# Patient Record
Sex: Female | Born: 1980 | Race: Black or African American | Hispanic: No | Marital: Single | Smoking: Never smoker
Health system: Southern US, Community
[De-identification: ages and names within clinical notes are randomized; demographics above are authoritative.]

## PROBLEM LIST (undated history)

## (undated) DIAGNOSIS — Z5189 Encounter for other specified aftercare: Secondary | ICD-10-CM

## (undated) DIAGNOSIS — E039 Hypothyroidism, unspecified: Secondary | ICD-10-CM

## (undated) DIAGNOSIS — I10 Essential (primary) hypertension: Secondary | ICD-10-CM

## (undated) DIAGNOSIS — R011 Cardiac murmur, unspecified: Secondary | ICD-10-CM

## (undated) DIAGNOSIS — S060XAA Concussion with loss of consciousness status unknown, initial encounter: Secondary | ICD-10-CM

## (undated) DIAGNOSIS — D649 Anemia, unspecified: Secondary | ICD-10-CM

## (undated) DIAGNOSIS — K529 Noninfective gastroenteritis and colitis, unspecified: Secondary | ICD-10-CM

## (undated) DIAGNOSIS — K5790 Diverticulosis of intestine, part unspecified, without perforation or abscess without bleeding: Secondary | ICD-10-CM

## (undated) DIAGNOSIS — S060X9A Concussion with loss of consciousness of unspecified duration, initial encounter: Secondary | ICD-10-CM

## (undated) DIAGNOSIS — H409 Unspecified glaucoma: Secondary | ICD-10-CM

## (undated) HISTORY — DX: Diverticulosis of intestine, part unspecified, without perforation or abscess without bleeding: K57.90

## (undated) HISTORY — DX: Essential (primary) hypertension: I10

## (undated) HISTORY — PX: ROTATOR CUFF REPAIR: SHX139

## (undated) HISTORY — DX: Concussion with loss of consciousness of unspecified duration, initial encounter: S06.0X9A

## (undated) HISTORY — PX: DILATATION & CURETTAGE/HYSTEROSCOPY WITH MYOSURE: SHX6511

## (undated) HISTORY — DX: Unspecified glaucoma: H40.9

## (undated) HISTORY — DX: Cardiac murmur, unspecified: R01.1

## (undated) HISTORY — DX: Noninfective gastroenteritis and colitis, unspecified: K52.9

## (undated) HISTORY — PX: COLONOSCOPY: SHX174

## (undated) HISTORY — DX: Concussion with loss of consciousness status unknown, initial encounter: S06.0XAA

## (undated) HISTORY — DX: Hypothyroidism, unspecified: E03.9

## (undated) HISTORY — PX: OTHER SURGICAL HISTORY: SHX169

## (undated) HISTORY — PX: TUBAL LIGATION: SHX77

## (undated) HISTORY — PX: HYSTEROSCOPY: SHX211

## (undated) HISTORY — PX: CYST EXCISION: SHX5701

---

## 2017-04-06 ENCOUNTER — Emergency Department (HOSPITAL_BASED_OUTPATIENT_CLINIC_OR_DEPARTMENT_OTHER)
Admission: EM | Admit: 2017-04-06 | Discharge: 2017-04-06 | Disposition: A | Discharge status: Home / Self Care | Payer: Medicaid - Out of State | Attending: Emergency Medicine | Admitting: Emergency Medicine

## 2017-04-06 ENCOUNTER — Emergency Department (HOSPITAL_BASED_OUTPATIENT_CLINIC_OR_DEPARTMENT_OTHER): Payer: Medicaid - Out of State

## 2017-04-06 ENCOUNTER — Encounter (HOSPITAL_BASED_OUTPATIENT_CLINIC_OR_DEPARTMENT_OTHER): Payer: Self-pay

## 2017-04-06 DIAGNOSIS — M545 Low back pain, unspecified: Secondary | ICD-10-CM

## 2017-04-06 DIAGNOSIS — Z91040 Latex allergy status: Secondary | ICD-10-CM | POA: Diagnosis not present

## 2017-04-06 DIAGNOSIS — R5383 Other fatigue: Secondary | ICD-10-CM | POA: Diagnosis present

## 2017-04-06 DIAGNOSIS — R1032 Left lower quadrant pain: Secondary | ICD-10-CM | POA: Insufficient documentation

## 2017-04-06 HISTORY — DX: Encounter for other specified aftercare: Z51.89

## 2017-04-06 HISTORY — DX: Anemia, unspecified: D64.9

## 2017-04-06 LAB — CBC WITH DIFFERENTIAL/PLATELET
Basophils Absolute: 0 10*3/uL (ref 0.0–0.1)
Basophils Relative: 1 %
Eosinophils Absolute: 0.1 10*3/uL (ref 0.0–0.7)
Eosinophils Relative: 1 %
HEMATOCRIT: 33.4 % — AB (ref 36.0–46.0)
HEMOGLOBIN: 11.4 g/dL — AB (ref 12.0–15.0)
LYMPHS PCT: 30 %
Lymphs Abs: 1.7 10*3/uL (ref 0.7–4.0)
MCH: 31.1 pg (ref 26.0–34.0)
MCHC: 34.1 g/dL (ref 30.0–36.0)
MCV: 91 fL (ref 78.0–100.0)
MONOS PCT: 11 %
Monocytes Absolute: 0.6 10*3/uL (ref 0.1–1.0)
NEUTROS ABS: 3.1 10*3/uL (ref 1.7–7.7)
NEUTROS PCT: 57 %
Platelets: 263 10*3/uL (ref 150–400)
RBC: 3.67 MIL/uL — ABNORMAL LOW (ref 3.87–5.11)
RDW: 12.7 % (ref 11.5–15.5)
WBC: 5.5 10*3/uL (ref 4.0–10.5)

## 2017-04-06 LAB — COMPREHENSIVE METABOLIC PANEL
ALBUMIN: 3.7 g/dL (ref 3.5–5.0)
ALK PHOS: 51 U/L (ref 38–126)
ALT: 10 U/L — ABNORMAL LOW (ref 14–54)
ANION GAP: 4 — AB (ref 5–15)
AST: 13 U/L — AB (ref 15–41)
BUN: 7 mg/dL (ref 6–20)
CO2: 26 mmol/L (ref 22–32)
Calcium: 8.8 mg/dL — ABNORMAL LOW (ref 8.9–10.3)
Chloride: 106 mmol/L (ref 101–111)
Creatinine, Ser: 0.56 mg/dL (ref 0.44–1.00)
GFR calc Af Amer: >60 mL/min (ref >60)
GFR calc non Af Amer: >60 mL/min (ref >60)
GLUCOSE: 81 mg/dL (ref 65–99)
Potassium: 3.9 mmol/L (ref 3.5–5.1)
Sodium: 136 mmol/L (ref 135–145)
Total Bilirubin: 0.2 mg/dL — ABNORMAL LOW (ref 0.3–1.2)
Total Protein: 7.2 g/dL (ref 6.5–8.1)

## 2017-04-06 LAB — URINALYSIS, ROUTINE W REFLEX MICROSCOPIC
BILIRUBIN URINE: NEGATIVE
GLUCOSE, UA: NEGATIVE mg/dL
HGB URINE DIPSTICK: NEGATIVE
Ketones, ur: NEGATIVE mg/dL
Leukocytes, UA: NEGATIVE
NITRITE: NEGATIVE
PH: 7 (ref 5.0–8.0)
Protein, ur: NEGATIVE mg/dL
SPECIFIC GRAVITY, URINE: 1.015 (ref 1.005–1.030)

## 2017-04-06 LAB — LIPASE, BLOOD: Lipase: 30 U/L (ref 11–51)

## 2017-04-06 LAB — PREGNANCY, URINE: Preg Test, Ur: NEGATIVE

## 2017-04-06 MED ORDER — IOPAMIDOL (ISOVUE-300) INJECTION 61%
100.0000 mL | Freq: Once | INTRAVENOUS | Status: AC | PRN
  Administered 2017-04-06: 100 mL via INTRAVENOUS

## 2017-04-06 MED ORDER — TRAMADOL HCL 50 MG PO TABS: 50.0000 mg | ORAL_TABLET | Freq: Four times a day (QID) | ORAL | 0 refills | Status: DC | PRN

## 2017-04-06 MED ORDER — SODIUM CHLORIDE 0.9 % IV BOLUS (SEPSIS)
500.0000 mL | Freq: Once | INTRAVENOUS | Status: AC
  Administered 2017-04-06: 500 mL via INTRAVENOUS

## 2017-04-06 MED ORDER — SODIUM CHLORIDE 0.9 % IV SOLN
INTRAVENOUS | Status: DC
  Administered 2017-04-06: 11:00:00 via INTRAVENOUS

## 2017-04-06 MED FILL — traMADol HCL 50 MG TABS: 50 | 3 days supply | Qty: 15 | Fill #0

## 2017-04-06 NOTE — ED Notes (Signed)
Pt drinking PO contrast

## 2017-04-06 NOTE — ED Provider Notes (Signed)
[facility]   CSN: [id number] Arrival date & time: 04/06/17  0865     History   Chief Complaint Chief Complaint  Patient presents with  . Fatigue    HPI Christine Mccormick is a 36 y.o. female.  Patient status post D&C 5 weeks ago. Patient has past history of anemia. Patient's had increased fatigue since yesterday. Worried about her being anemic again. Also with the complaint of left-sided abdominal pain and left low back pain that radiates down into her leg. No numbness to her feet. No fever no chills. Patient denies any vaginal bleeding or any vaginal discharge at this time. Patient's primary care doctor and OB/GYN or in the [place] area.      Past Medical History:  Diagnosis Date  . Anemia   . Blood transfusion without reported diagnosis     There are no active problems to display for this patient.   Past Surgical History:  Procedure Laterality Date  . DILATATION & CURETTAGE/HYSTEROSCOPY WITH MYOSURE    . HYSTEROSCOPY      OB History    No data available       Home Medications    Prior to Admission medications   Medication Sig Start Date End Date Taking? Authorizing Provider  traMADol (ULTRAM) 50 MG tablet Take 1 tablet (50 mg total) by mouth every 6 (six) hours as needed. 04/06/17   Vanetta Mulders, MD    Family History No family history on file.  Social History Social History  Substance Use Topics  . Smoking status: Never Smoker  . Smokeless tobacco: Never Used  . Alcohol use Yes     Allergies   Latex   Review of Systems Review of Systems  Constitutional: Positive for fatigue. Negative for fever.  HENT: Negative for congestion.   Eyes: Negative for redness.  Respiratory: Negative for shortness of breath.   Cardiovascular: Negative for chest pain.  Gastrointestinal: Positive for abdominal pain.  Genitourinary: Negative for dysuria.  Musculoskeletal: Positive for back pain.  Skin: Negative for rash.    Neurological: Positive for weakness. Negative for headaches.  Hematological: Does not bruise/bleed easily.  Psychiatric/Behavioral: Negative for confusion.     Physical Exam Updated Vital Signs BP 104/74 (BP Location: Right Arm)   Pulse 90   Temp 98.4 F (36.9 C) (Oral)   Resp 18   Ht 1.676 m ( )   Wt 54.4 kg (120 lb)   SpO2 100%   BMI 19.37 kg/m   Physical Exam  Constitutional: She is oriented to person, place, and time. She appears well-developed and well-nourished.  HENT:  Head: Normocephalic and atraumatic.  Mouth/Throat: Oropharynx is clear and moist.  Eyes: Pupils are equal, round, and reactive to light. Conjunctivae and EOM are normal.  Neck: Normal range of motion.  Cardiovascular: Normal rate, regular rhythm and normal heart sounds.   Pulmonary/Chest: Effort normal and breath sounds normal. No respiratory distress.  Abdominal: Soft. Bowel sounds are normal. There is no tenderness.  Musculoskeletal: Normal range of motion.  No tenderness to palpation to the lumbar back area.  Neurological: She is alert and oriented to person, place, and time. No cranial nerve deficit or sensory deficit. She exhibits normal muscle tone. Coordination normal.  Skin: Skin is warm. Capillary refill takes less than 2 seconds. No rash noted.  Nursing note and vitals reviewed.    ED Treatments / Results  Labs (all labs ordered are listed, but only abnormal results are displayed) Labs Reviewed  COMPREHENSIVE  METABOLIC PANEL - Abnormal; Notable for the following:       Result Value   Calcium 8.8 (*)    AST 13 (*)    ALT 10 (*)    Total Bilirubin 0.2 (*)    Anion gap 4 (*)    All other components within normal limits  CBC WITH DIFFERENTIAL/PLATELET - Abnormal; Notable for the following:    RBC 3.67 (*)    Hemoglobin 11.4 (*)    HCT 33.4 (*)    All other components within normal limits  LIPASE, BLOOD  URINALYSIS, ROUTINE W REFLEX MICROSCOPIC  PREGNANCY, URINE    EKG  EKG  Interpretation None       Radiology Ct Abdomen Pelvis W Contrast  Result Date: 04/06/2017 CLINICAL DATA:  Left lower quadrant pain radiating to the left leg. EXAM: CT ABDOMEN AND PELVIS WITH CONTRAST TECHNIQUE: Multidetector CT imaging of the abdomen and pelvis was performed using the standard protocol following bolus administration of intravenous contrast. CONTRAST:  ISOVUE-300 IOPAMIDOL (ISOVUE-300) INJECTION 61% COMPARISON:  None. FINDINGS: Lower chest: No acute abnormality. Hepatobiliary: The liver is normal. No focal liver lesion is identified. There is question gallbladder wall thickening. No inflammation is noted around the gallbladder. The biliary tree is normal. Pancreas: Unremarkable. No pancreatic ductal dilatation or surrounding inflammatory changes. Spleen: Normal in size without focal abnormality. Adrenals/Urinary Tract: Adrenal glands are unremarkable. Kidneys are normal, without renal calculi, focal lesion, or hydronephrosis. Bladder is unremarkable. Stomach/Bowel: Stomach is within normal limits. Appendix appears normal. No evidence of bowel wall thickening, distention, or inflammatory changes. Vascular/Lymphatic: No significant vascular findings are present. No enlarged abdominal or pelvic lymph nodes. Reproductive: There is evidence of prior hysteroscopy with myosure. There is a subtle slight low-density masslike lesion in the left cervix/ left lower uterine segment measuring 3.5 x 4.1 cm. No adnexal masses are noted. Other: No abdominal wall hernia or abnormality. No abdominopelvic ascites. Musculoskeletal: No acute or significant osseous findings. IMPRESSION: No acute abnormality identified within the abdomen. 4.1 cm slight low-density lesion in the left lower segment/left cervix. This is nonspecific but could represent an uterine fibroid. Electronically Signed   By: Sherian Rein M.D.   On: 04/06/2017 12:23    Procedures Procedures (including critical care  time)  Medications Ordered in ED Medications  0.9 %  sodium chloride infusion ( Intravenous New Bag/Given 04/06/17 1059)  sodium chloride 0.9 % bolus 500 mL (0 mLs Intravenous Stopped 04/06/17 1131)  iopamidol (ISOVUE-300) 61 % injection 100 mL (100 mLs Intravenous Contrast Given 04/06/17 1151)     Initial Impression / Assessment and Plan / ED Course  I have reviewed the triage vital signs and the nursing notes.  Pertinent labs & imaging results that were available during my care of the patient were reviewed by me and considered in my medical decision making (see chart for details).    Workup for the fatigue and left-sided abdominal pain and low back pain with radiation in the leg without any focal neural deficits. Without any significant findings. Patient with mild anemia but not severe anemia. Patient's abdomen without significant tenderness. CT scan of the abdomen was negative. Patient denies any vaginal discharge or vaginal bleeding.   Would recommend thyroid function testing be done.     etiology of patient's symptoms is not clear. Patient nontoxic no acute distress. Will provide work note treat with tramadol have patient follow back up with her OB/GYN in [place] and her primary care Dr. Newt Lukes.  Fi as  well as annal Clinical Impressions(s) / ED Diagnoses   Final diagnoses:  Fatigue, unspecified type  Acute left-sided low back pain without sciatica  Left lower quadrant pain    New Prescriptions New Prescriptions   TRAMADOL (ULTRAM) 50 MG TABLET    Take 1 tablet (50 mg total) by mouth every 6 (six) hours as needed.     Vanetta Mulders, MD 04/06/17 1351

## 2017-04-06 NOTE — ED Notes (Signed)
Pt transported to CT ?

## 2017-04-06 NOTE — ED Triage Notes (Signed)
Pt reports recent D&C with hysteroscopy approximately 5 weeks ago. Hx of anemia, no check after surgery. Pt reports increased fatigue since yesterday. Also sts left lower abdominal pain radiating down into left leg.

## 2017-04-06 NOTE — Discharge Instructions (Signed)
Follow-up with your OB/GYN for recheck following the D&C. Take tramadol as directed. Some symptoms seem to be related to low back. Make an appointment for primary care doctor follow-up in the [place] area. Would recommend having thyroid function tests evaluated.

## 2017-05-28 ENCOUNTER — Other Ambulatory Visit: Payer: Self-pay

## 2017-05-28 ENCOUNTER — Encounter (HOSPITAL_COMMUNITY): Payer: Self-pay

## 2017-05-28 ENCOUNTER — Emergency Department (HOSPITAL_COMMUNITY): Payer: Medicaid - Out of State

## 2017-05-28 ENCOUNTER — Emergency Department (HOSPITAL_COMMUNITY)
Admission: EM | Admit: 2017-05-28 | Discharge: 2017-05-29 | Disposition: A | Discharge status: Home / Self Care | Payer: Medicaid - Out of State | Attending: Emergency Medicine | Admitting: Emergency Medicine

## 2017-05-28 DIAGNOSIS — Z79899 Other long term (current) drug therapy: Secondary | ICD-10-CM | POA: Insufficient documentation

## 2017-05-28 DIAGNOSIS — Z91040 Latex allergy status: Secondary | ICD-10-CM | POA: Insufficient documentation

## 2017-05-28 DIAGNOSIS — R1084 Generalized abdominal pain: Secondary | ICD-10-CM | POA: Diagnosis not present

## 2017-05-28 DIAGNOSIS — R0602 Shortness of breath: Secondary | ICD-10-CM

## 2017-05-28 DIAGNOSIS — R079 Chest pain, unspecified: Secondary | ICD-10-CM

## 2017-05-28 LAB — CBC
HEMATOCRIT: 38.1 % (ref 36.0–46.0)
Hemoglobin: 12.7 g/dL (ref 12.0–15.0)
MCH: 30.8 pg (ref 26.0–34.0)
MCHC: 33.3 g/dL (ref 30.0–36.0)
MCV: 92.3 fL (ref 78.0–100.0)
Platelets: 265 10*3/uL (ref 150–400)
RBC: 4.13 MIL/uL (ref 3.87–5.11)
RDW: 13 % (ref 11.5–15.5)
WBC: 7.5 10*3/uL (ref 4.0–10.5)

## 2017-05-28 LAB — BASIC METABOLIC PANEL
ANION GAP: 7 (ref 5–15)
BUN: 13 mg/dL (ref 6–20)
CHLORIDE: 107 mmol/L (ref 101–111)
CO2: 23 mmol/L (ref 22–32)
Calcium: 9.1 mg/dL (ref 8.9–10.3)
Creatinine, Ser: 0.83 mg/dL (ref 0.44–1.00)
GFR calc Af Amer: >60 mL/min (ref >60)
GFR calc non Af Amer: >60 mL/min (ref >60)
GLUCOSE: 109 mg/dL — AB (ref 65–99)
POTASSIUM: 4.2 mmol/L (ref 3.5–5.1)
Sodium: 137 mmol/L (ref 135–145)

## 2017-05-28 LAB — HEPATIC FUNCTION PANEL
ALT: 13 U/L — AB (ref 14–54)
AST: 17 U/L (ref 15–41)
Albumin: 4 g/dL (ref 3.5–5.0)
Alkaline Phosphatase: 57 U/L (ref 38–126)
TOTAL PROTEIN: 7.7 g/dL (ref 6.5–8.1)
Total Bilirubin: 0.3 mg/dL (ref 0.3–1.2)

## 2017-05-28 LAB — LIPASE, BLOOD: LIPASE: 21 U/L (ref 11–51)

## 2017-05-28 LAB — PROTIME-INR
INR: 0.96
Prothrombin Time: 12.7 seconds (ref 11.4–15.2)

## 2017-05-28 LAB — I-STAT TROPONIN, ED: Troponin i, poc: 0 ng/mL (ref 0.00–0.08)

## 2017-05-28 MED ORDER — IOPAMIDOL (ISOVUE-300) INJECTION 61%
INTRAVENOUS | Status: AC
  Administered 2017-05-28: 30 mL via ORAL
  Filled 2017-05-28: qty 30

## 2017-05-28 MED ORDER — IOPAMIDOL (ISOVUE-370) INJECTION 76%
INTRAVENOUS | Status: AC
  Administered 2017-05-28: 100 mL via INTRAVENOUS
  Filled 2017-05-28: qty 100

## 2017-05-28 MED ORDER — HYDROMORPHONE HCL 1 MG/ML IJ SOLN
1.0000 mg | Freq: Once | INTRAMUSCULAR | Status: AC
  Administered 2017-05-28: 1 mg via INTRAVENOUS
  Filled 2017-05-28: qty 1

## 2017-05-28 MED ORDER — ONDANSETRON HCL 4 MG/2ML IJ SOLN
4.0000 mg | Freq: Once | INTRAMUSCULAR | Status: AC
  Administered 2017-05-28: 4 mg via INTRAVENOUS
  Filled 2017-05-28: qty 2

## 2017-05-28 MED ORDER — SODIUM CHLORIDE 0.9 % IV BOLUS (SEPSIS)
1000.0000 mL | Freq: Once | INTRAVENOUS | Status: AC
  Administered 2017-05-28: 1000 mL via INTRAVENOUS

## 2017-05-28 NOTE — ED Provider Notes (Signed)
[facility]-EMERGENCY DEPT Provider Note   CSN: [id number] Arrival date & time: 05/28/17  1831     History   Chief Complaint Chief Complaint  Patient presents with  . Abdominal Cramping    post op  . Cough  . Chest Pain    HPI Carollee HerterLatierra Schlagel is a 36 y.o. female.    The history is provided by the patient, medical records and the spouse. No language interpreter was used.  Chest Pain   This is a new problem. The current episode started 2 days ago. The problem occurs constantly. The problem has not changed since onset.The pain is associated with exertion and breathing. The pain is present in the substernal region. The pain is at a severity of 8/10. The pain is severe. The quality of the pain is described as heavy, pressure-like and exertional. The pain does not radiate. Duration of episode(s) is 3 days. The symptoms are aggravated by deep breathing and exertion. Associated symptoms include abdominal pain, cough, exertional chest pressure, nausea and shortness of breath. Pertinent negatives include no back pain, no diaphoresis, no fever, no headaches, no hemoptysis, no lower extremity edema, no numbness, no palpitations, no sputum production, no syncope, no vomiting and no weakness. She has tried nothing for the symptoms. The treatment provided no relief.  Shortness of Breath  Associated symptoms include cough, chest pain and abdominal pain. Pertinent negatives include no fever, no headaches, no neck pain, no sputum production, no hemoptysis, no wheezing, no syncope, no vomiting, no rash and no leg swelling.    Past Medical History:  Diagnosis Date  . Anemia   . Blood transfusion without reported diagnosis     There are no active problems to display for this patient.   Past Surgical History:  Procedure Laterality Date  . DILATATION & CURETTAGE/HYSTEROSCOPY WITH MYOSURE    . HYSTEROSCOPY      OB History    No data available       Home Medications      Prior to Admission medications   Medication Sig Start Date End Date Taking? Authorizing Provider  traMADol (ULTRAM) 50 MG tablet Take 1 tablet (50 mg total) by mouth every 6 (six) hours as needed. 04/06/17   Vanetta MuldersZackowski, Scott, MD    Family History History reviewed. No pertinent family history.  Social History Social History   Tobacco Use  . Smoking status: Never Smoker  . Smokeless tobacco: Never Used  Substance Use Topics  . Alcohol use: Yes  . Drug use: Not on file     Allergies   Latex   Review of Systems Review of Systems  Constitutional: Positive for chills. Negative for diaphoresis, fatigue and fever.  HENT: Negative for congestion.   Respiratory: Positive for cough, chest tightness and shortness of breath. Negative for hemoptysis, sputum production, wheezing and stridor.   Cardiovascular: Positive for chest pain. Negative for palpitations, leg swelling and syncope.  Gastrointestinal: Positive for abdominal pain and nausea. Negative for constipation, diarrhea and vomiting.  Genitourinary: Negative for dysuria, flank pain and frequency.  Musculoskeletal: Negative for back pain, neck pain and neck stiffness.  Skin: Positive for wound. Negative for rash.  Neurological: Negative for weakness, numbness and headaches.  Psychiatric/Behavioral: Negative for agitation.  All other systems reviewed and are negative.    Physical Exam Updated Vital Signs BP (!) 133/91 (BP Location: Left Arm)   Pulse (!) 114   Temp 98.3 F (36.8 C) (Oral)   Resp 18   SpO2  100%   Physical Exam  Constitutional: She is oriented to person, place, and time. She appears well-developed and well-nourished. No distress.  Cardiovascular: Intact distal pulses. Tachycardia present.  No murmur heard. Pulmonary/Chest: Effort normal. No stridor. Tachypnea noted. No respiratory distress. She has no wheezes. She has no rhonchi. She has no rales. She exhibits no tenderness.  Abdominal: Soft. She  exhibits no distension. There is tenderness in the suprapubic area. There is no CVA tenderness.    Patient has tenderness all over the abdomen but worse in the lower abdomen.  Patient has 3 surgical sites from her surgery several days ago.  No purulence is seen with no erythema present.  Diffuse tenderness.  Bowel sounds present.  Musculoskeletal: She exhibits no edema or tenderness.  Neurological: She is alert and oriented to person, place, and time. No cranial nerve deficit or sensory deficit. She exhibits normal muscle tone.  Skin: Capillary refill takes less than 2 seconds. No rash noted. She is not diaphoretic.  Psychiatric: She has a normal mood and affect.  Nursing note and vitals reviewed.    ED Treatments / Results  Labs (all labs ordered are listed, but only abnormal results are displayed) Labs Reviewed  BASIC METABOLIC PANEL - Abnormal; Notable for the following components:      Result Value   Glucose, Bld 109 (*)    All other components within normal limits  HEPATIC FUNCTION PANEL - Abnormal; Notable for the following components:   ALT 13 (*)    Bilirubin, Direct <0.1 (*)    All other components within normal limits  CBC  PROTIME-INR  LIPASE, BLOOD  I-STAT TROPONIN, ED    EKG  EKG Interpretation  Date/Time:  Saturday May 28 2017 19:44:27 EST Ventricular Rate:  97 PR Interval:    QRS Duration: 74 QT Interval:  328 QTC Calculation: 417 R Axis:   76 Text Interpretation:  Sinus rhythm Borderline short PR interval Baseline wander in lead(s) V1 V5 V6 No prior ECG for comparuson. T wave inversion in lead 3.  No STEMI Confirmed by Theda Belfast ([id number]) on 05/28/2017 8:21:08 PM       Radiology Dg Chest 2 View  Result Date: 05/28/2017 CLINICAL DATA:  Left-sided chest pain 2 days ago. Mid chest pain today. EXAM: CHEST  2 VIEW COMPARISON:  None. FINDINGS: Free air is seen under the diaphragm. The heart, hila, and mediastinum are normal. No pneumothorax. No  nodules, masses, or focal infiltrates. No other acute abnormalities. IMPRESSION: Free air under the diaphragm.  No abnormalities seen in the chest. Findings discussed with Dr. Clarene Duke. Electronically Signed   By: Gerome Sam III M.D   On: 05/28/2017 19:43   Ct Angio Chest Pe W And/or Wo Contrast  Result Date: 05/28/2017 CLINICAL DATA:  Subacute onset of generalized chest pain and cough. Chest soreness. Shortness of breath with exertion. EXAM: CT ANGIOGRAPHY CHEST CT ABDOMEN AND PELVIS WITH CONTRAST TECHNIQUE: Multidetector CT imaging of the chest was performed using the standard protocol during bolus administration of intravenous contrast. Multiplanar CT image reconstructions and MIPs were obtained to evaluate the vascular anatomy. Multidetector CT imaging of the abdomen and pelvis was performed using the standard protocol during bolus administration of intravenous contrast. CONTRAST:  100 mL ISOVUE-370 IOPAMIDOL (ISOVUE-370) INJECTION 76% COMPARISON:  Chest radiograph performed earlier today at 7:30 p.m., and CT of the abdomen and pelvis performed 04/06/2017 FINDINGS: CTA CHEST FINDINGS Cardiovascular:  There is no evidence of pulmonary embolus. The heart is normal in  size. The thoracic aorta is unremarkable. The great vessels are grossly unremarkable. Mediastinum/Nodes: The mediastinum is unremarkable in appearance. No mediastinal lymphadenopathy is seen. No pericardial effusion is identified. The visualized portions of the thyroid gland are unremarkable. No axillary lymphadenopathy is seen. Lungs/Pleura: The lungs are clear bilaterally. No focal consolidation, pleural effusion or pneumothorax is seen. No masses are identified. Musculoskeletal: No acute osseous abnormalities are identified. The visualized musculature is unremarkable in appearance. Review of the MIP images confirms the above findings. CT ABDOMEN and PELVIS FINDINGS Hepatobiliary: The liver is unremarkable in appearance. The gallbladder is  unremarkable in appearance. The common bile duct remains normal in caliber. Pancreas: The pancreas is within normal limits. Spleen: The spleen is unremarkable in appearance. Adrenals/Urinary Tract: The adrenal glands are unremarkable in appearance. The kidneys are within normal limits. There is no evidence of hydronephrosis. No renal or ureteral stones are identified. No perinephric stranding is seen. Stomach/Bowel: The stomach is unremarkable in appearance. The small bowel is within normal limits. The appendix is normal in caliber, without evidence of appendicitis. Scattered diverticulosis is noted along the ascending colon, without evidence of diverticulitis. Vascular/Lymphatic: The abdominal aorta is unremarkable in appearance. The inferior vena cava is grossly unremarkable. No retroperitoneal lymphadenopathy is seen. No pelvic sidewall lymphadenopathy is identified. Reproductive: The bladder is mildly distended and grossly unremarkable. There is a diffusely heterogeneous appearance to the uterus, with a small amount of air in the endometrial canal, likely reflecting recent endometrial ablation. A small amount of free fluid within the pelvis is likely postoperative in nature. The ovaries are grossly symmetric. No suspicious adnexal masses are seen. Other: A small amount of free intraperitoneal air is thought to reflect the recent endometrial ablation. There is no definite evidence of bowel perforation at this time Musculoskeletal: No acute osseous abnormalities are identified. The visualized musculature is unremarkable in appearance. Review of the MIP images confirms the above findings. IMPRESSION: 1. No evidence of pulmonary embolus. 2. Lungs clear bilaterally. 3. Small amount of free intraperitoneal air is thought to reflect the recent endometrial ablation, with air from fallopian tubes. 4. Diffusely heterogeneous appearance to the uterus, with a small amount of air in the endometrial canal, likely reflecting  recent endometrial ablation. 5. Scattered diverticulosis along the ascending colon, without evidence of diverticulitis. Electronically Signed   By: Roanna RaiderJeffery  Chang M.D.   On: 05/28/2017 23:45   Ct Abdomen Pelvis W Contrast  Result Date: 05/28/2017 CLINICAL DATA:  Subacute onset of generalized chest pain and cough. Chest soreness. Shortness of breath with exertion. EXAM: CT ANGIOGRAPHY CHEST CT ABDOMEN AND PELVIS WITH CONTRAST TECHNIQUE: Multidetector CT imaging of the chest was performed using the standard protocol during bolus administration of intravenous contrast. Multiplanar CT image reconstructions and MIPs were obtained to evaluate the vascular anatomy. Multidetector CT imaging of the abdomen and pelvis was performed using the standard protocol during bolus administration of intravenous contrast. CONTRAST:  100 mL ISOVUE-370 IOPAMIDOL (ISOVUE-370) INJECTION 76% COMPARISON:  Chest radiograph performed earlier today at 7:30 p.m., and CT of the abdomen and pelvis performed 04/06/2017 FINDINGS: CTA CHEST FINDINGS Cardiovascular:  There is no evidence of pulmonary embolus. The heart is normal in size. The thoracic aorta is unremarkable. The great vessels are grossly unremarkable. Mediastinum/Nodes: The mediastinum is unremarkable in appearance. No mediastinal lymphadenopathy is seen. No pericardial effusion is identified. The visualized portions of the thyroid gland are unremarkable. No axillary lymphadenopathy is seen. Lungs/Pleura: The lungs are clear bilaterally. No focal consolidation, pleural effusion or  pneumothorax is seen. No masses are identified. Musculoskeletal: No acute osseous abnormalities are identified. The visualized musculature is unremarkable in appearance. Review of the MIP images confirms the above findings. CT ABDOMEN and PELVIS FINDINGS Hepatobiliary: The liver is unremarkable in appearance. The gallbladder is unremarkable in appearance. The common bile duct remains normal in caliber.  Pancreas: The pancreas is within normal limits. Spleen: The spleen is unremarkable in appearance. Adrenals/Urinary Tract: The adrenal glands are unremarkable in appearance. The kidneys are within normal limits. There is no evidence of hydronephrosis. No renal or ureteral stones are identified. No perinephric stranding is seen. Stomach/Bowel: The stomach is unremarkable in appearance. The small bowel is within normal limits. The appendix is normal in caliber, without evidence of appendicitis. Scattered diverticulosis is noted along the ascending colon, without evidence of diverticulitis. Vascular/Lymphatic: The abdominal aorta is unremarkable in appearance. The inferior vena cava is grossly unremarkable. No retroperitoneal lymphadenopathy is seen. No pelvic sidewall lymphadenopathy is identified. Reproductive: The bladder is mildly distended and grossly unremarkable. There is a diffusely heterogeneous appearance to the uterus, with a small amount of air in the endometrial canal, likely reflecting recent endometrial ablation. A small amount of free fluid within the pelvis is likely postoperative in nature. The ovaries are grossly symmetric. No suspicious adnexal masses are seen. Other: A small amount of free intraperitoneal air is thought to reflect the recent endometrial ablation. There is no definite evidence of bowel perforation at this time Musculoskeletal: No acute osseous abnormalities are identified. The visualized musculature is unremarkable in appearance. Review of the MIP images confirms the above findings. IMPRESSION: 1. No evidence of pulmonary embolus. 2. Lungs clear bilaterally. 3. Small amount of free intraperitoneal air is thought to reflect the recent endometrial ablation, with air from fallopian tubes. 4. Diffusely heterogeneous appearance to the uterus, with a small amount of air in the endometrial canal, likely reflecting recent endometrial ablation. 5. Scattered diverticulosis along the ascending  colon, without evidence of diverticulitis. Electronically Signed   By: Roanna Raider M.D.   On: 05/28/2017 23:45    Procedures Procedures (including critical care time)  Medications Ordered in ED Medications  HYDROmorphone (DILAUDID) injection 1 mg (1 mg Intravenous Given 05/28/17 2107)  ondansetron (ZOFRAN) injection 4 mg (4 mg Intravenous Given 05/28/17 2107)  sodium chloride 0.9 % bolus 1,000 mL (0 mLs Intravenous Stopped 05/29/17 0013)  iopamidol (ISOVUE-370) 76 % injection (100 mLs Intravenous Contrast Given 05/28/17 2253)  iopamidol (ISOVUE-300) 61 % injection (30 mLs Oral Contrast Given 05/28/17 2253)     Initial Impression / Assessment and Plan / ED Course  I have reviewed the triage vital signs and the nursing notes.  Pertinent labs & imaging results that were available during my care of the patient were reviewed by me and considered in my medical decision making (see chart for details).     Zemirah Krasinski is a 36 y.o. female with a past medical history significant for anemia as well as recent OB/GYN abdominal surgery who presents with chills, shortness of breath, chest pain, abdominal pain, nausea, and cough.  Patient reports that 5 days ago, she had laparoscopic abdominal surgery for endometriosis ablation.  She reports that this was performed by a Careers adviser in [place].  She reports that she has had abdominal pain since the procedure that has been worsening.  She reports that for the last 2 days she has had shortness of breath and chest pain.  She says that she has not been eating and drinking as  much.  She reports no constipation or diarrhea but does report a cough.  She denies production of the cough.  She denies any urinary symptoms.  She denies leg pain leg swelling.  No history of PE or DVT.  She denies any family history of heart disease.  She describes her chest pain as sharp.  She reports that it is exertional at times but is not pleuritic.  On exam, lungs are clear.   Chest is nontender.  Abdomen is tender.  Patient has well appearing laparoscopic port sites.  No purulence or erythema surrounding.  Normal bowel sounds.  Back nontender.  No focal neurologic deficits.  Patient was tachycardic on arrival.  Due to the tachycardia, chest pain, and shortness of breath with recent surgery, CT PE study was ordered to rule out pulmonary embolism.  With the cough, CT will also look for pneumonia.  Laboratory testing collected to look for intra-abdominal abnormality.  Given the tenderness in the postsurgical site, CT of the abdomen and pelvis was also ordered to look for postoperative infection or bowel injury.  Patient did not provide a urinalysis as she had no urinary symptoms and did not want to provide a urinalysis.  Laboratory testing showed no significant abnormalities.  CT imaging showed no evidence of postoperative infection or bowel injury.  Postoperative free air was seen likely from insufflation during the laparoscopic procedure.  Patient did not appear to have pulmonary embolism, pneumonia, or other postoperative problem in her chest.  I suspect the patient is having postoperative pain.  As there does not appear to be obstruction, infection, injury, or other significant abnormality, I do not feel patient requires surgical evaluation or admission.  Patient's pain improved with medications.  Patient reports that she has been taking a minimal amount of her pain medication provided.  Patient encouraged to take the medications and rest.  Patient will follow up with her surgeon in the next several days.  Return precautions were given and understood.  Patient had no other questions or concerns and was discharged in good condition.      Final Clinical Impressions(s) / ED Diagnoses   Final diagnoses:  Chest pain, unspecified type  Shortness of breath  Generalized abdominal pain    ED Discharge Orders    None      Clinical Impression: 1. Chest pain,  unspecified type   2. Shortness of breath   3. Generalized abdominal pain     Disposition: Discharge  Condition: Good  I have discussed the results, Dx and Tx plan with the pt(& family if present). He/she/they expressed understanding and agree(s) with the plan. Discharge instructions discussed at great length. Strict return precautions discussed and pt &/or family have verbalized understanding of the instructions. No further questions at time of discharge.    This SmartLink is deprecated. Use AVSMEDLIST instead to display the medication list for a patient.  Follow Up: [facility] [address] [place] [address] [phone] Schedule an appointment as soon as possible for a visit    [facility] [address] [id number] mc [place] [address] [phone]  If symptoms worsen     Medrith Veillon, Canary Brim, MD 05/29/17 [phone]

## 2017-05-28 NOTE — ED Triage Notes (Signed)
Pt reporting a cold x3 weeks. She states that it got better last week, but she had surgery (endometrial ablation) on Monday and now she feels like the cold is back. She is reporting chest pain with her cough and chest soreness . She states that the cough makes her feels like she is pulling out stiches. Pt is also reporting SOB with exertion. A&Ox4.

## 2017-05-29 NOTE — Discharge Instructions (Signed)
Your imaging and workup today showed no evidence of infections, blood clots, or complications from your surgery.  I suspect your pain is postoperative in nature.  Please use the pain medicine prescription that you were provided and follow-up with your OB/GYN.  If any symptoms change or worsen, please return to the nearest emergency department.

## 2017-11-30 ENCOUNTER — Other Ambulatory Visit: Payer: Self-pay

## 2017-11-30 ENCOUNTER — Emergency Department (HOSPITAL_COMMUNITY)
Admission: EM | Admit: 2017-11-30 | Discharge: 2017-12-01 | Disposition: A | Discharge status: Home / Self Care | Payer: Self-pay | Attending: Emergency Medicine | Admitting: Emergency Medicine

## 2017-11-30 ENCOUNTER — Emergency Department (HOSPITAL_COMMUNITY): Payer: Self-pay

## 2017-11-30 ENCOUNTER — Encounter (HOSPITAL_COMMUNITY): Payer: Self-pay | Admitting: Emergency Medicine

## 2017-11-30 DIAGNOSIS — K529 Noninfective gastroenteritis and colitis, unspecified: Secondary | ICD-10-CM | POA: Insufficient documentation

## 2017-11-30 LAB — COMPREHENSIVE METABOLIC PANEL
ALBUMIN: 3.9 g/dL (ref 3.5–5.0)
ALT: 13 U/L — ABNORMAL LOW (ref 14–54)
ANION GAP: 8 (ref 5–15)
AST: 13 U/L — ABNORMAL LOW (ref 15–41)
Alkaline Phosphatase: 52 U/L (ref 38–126)
BILIRUBIN TOTAL: 0.6 mg/dL (ref 0.3–1.2)
BUN: 9 mg/dL (ref 6–20)
CALCIUM: 9.1 mg/dL (ref 8.9–10.3)
CHLORIDE: 105 mmol/L (ref 101–111)
CO2: 24 mmol/L (ref 22–32)
Creatinine, Ser: 0.68 mg/dL (ref 0.44–1.00)
GFR calc Af Amer: >60 mL/min (ref >60)
GFR calc non Af Amer: >60 mL/min (ref >60)
GLUCOSE: 91 mg/dL (ref 65–99)
POTASSIUM: 4.1 mmol/L (ref 3.5–5.1)
SODIUM: 137 mmol/L (ref 135–145)
Total Protein: 7.6 g/dL (ref 6.5–8.1)

## 2017-11-30 LAB — URINALYSIS, ROUTINE W REFLEX MICROSCOPIC
Bacteria, UA: NONE SEEN
Bilirubin Urine: NEGATIVE
Glucose, UA: NEGATIVE mg/dL
KETONES UR: 5 mg/dL — AB
Leukocytes, UA: NEGATIVE
Nitrite: NEGATIVE
PROTEIN: NEGATIVE mg/dL
Specific Gravity, Urine: 1.021 (ref 1.005–1.030)
pH: 6 (ref 5.0–8.0)

## 2017-11-30 LAB — CBC
HEMATOCRIT: 37.6 % (ref 36.0–46.0)
HEMOGLOBIN: 12.9 g/dL (ref 12.0–15.0)
MCH: 30.9 pg (ref 26.0–34.0)
MCHC: 34.3 g/dL (ref 30.0–36.0)
MCV: 90 fL (ref 78.0–100.0)
Platelets: 281 10*3/uL (ref 150–400)
RBC: 4.18 MIL/uL (ref 3.87–5.11)
RDW: 13.4 % (ref 11.5–15.5)
WBC: 7.3 10*3/uL (ref 4.0–10.5)

## 2017-11-30 LAB — I-STAT BETA HCG BLOOD, ED (MC, WL, AP ONLY)

## 2017-11-30 LAB — LIPASE, BLOOD: LIPASE: 28 U/L (ref 11–51)

## 2017-11-30 MED ORDER — CIPROFLOXACIN IN D5W 400 MG/200ML IV SOLN
400.0000 mg | Freq: Once | INTRAVENOUS | Status: AC
  Administered 2017-12-01: 400 mg via INTRAVENOUS
  Filled 2017-11-30: qty 200

## 2017-11-30 MED ORDER — IOPAMIDOL (ISOVUE-300) INJECTION 61%
100.0000 mL | Freq: Once | INTRAVENOUS | Status: AC | PRN
  Administered 2017-11-30: 100 mL via INTRAVENOUS

## 2017-11-30 MED ORDER — KETOROLAC TROMETHAMINE 30 MG/ML IJ SOLN
15.0000 mg | Freq: Once | INTRAMUSCULAR | Status: AC
  Administered 2017-11-30: 15 mg via INTRAVENOUS
  Filled 2017-11-30: qty 1

## 2017-11-30 MED ORDER — IOPAMIDOL (ISOVUE-300) INJECTION 61%
INTRAVENOUS | Status: AC
  Filled 2017-11-30: qty 100

## 2017-11-30 MED ORDER — METRONIDAZOLE IN NACL 5-0.79 MG/ML-% IV SOLN
500.0000 mg | Freq: Once | INTRAVENOUS | Status: AC
Start: 2017-11-30 — End: 2017-12-01
  Administered 2017-11-30: 500 mg via INTRAVENOUS
  Filled 2017-11-30: qty 100

## 2017-11-30 MED ORDER — SODIUM CHLORIDE 0.9 % IV BOLUS
1000.0000 mL | Freq: Once | INTRAVENOUS | Status: AC
  Administered 2017-11-30: 1000 mL via INTRAVENOUS

## 2017-11-30 MED ORDER — CIPROFLOXACIN HCL 500 MG PO TABS: 500.0000 mg | ORAL_TABLET | Freq: Two times a day (BID) | ORAL | 0 refills | Status: DC

## 2017-11-30 MED ORDER — ONDANSETRON HCL 4 MG PO TABS: 4.0000 mg | ORAL_TABLET | Freq: Four times a day (QID) | ORAL | 0 refills | Status: DC

## 2017-11-30 MED ORDER — METRONIDAZOLE 500 MG PO TABS: 500.0000 mg | ORAL_TABLET | Freq: Two times a day (BID) | ORAL | 0 refills | Status: DC

## 2017-11-30 MED ORDER — ONDANSETRON HCL 4 MG/2ML IJ SOLN
4.0000 mg | Freq: Once | INTRAMUSCULAR | Status: AC
  Administered 2017-11-30: 4 mg via INTRAVENOUS
  Filled 2017-11-30: qty 2

## 2017-11-30 NOTE — ED Triage Notes (Signed)
Pt complaint of abdominal pain with n/v/d onset a week ago; concern for diverticulitis with hx of same.

## 2017-11-30 NOTE — Discharge Instructions (Signed)
Please return for any problem. Follow up with your regular physician and  GI physician as instructed.

## 2017-11-30 NOTE — ED Provider Notes (Signed)
[facility]-EMERGENCY DEPT Provider Note   CSN: [id number] Arrival date & time: 11/30/17  1803     History   Chief Complaint Chief Complaint  Patient presents with  . Abdominal Pain    HPI Christine Mccormick is a 37 y.o. female.  37 year old female with prior history of diverticulosis and endometriosis presents with complaint of abdominal pain.  Patient reports diffuse lower abdominal pain for the last 4 to 5 days.  She reports associated nausea and vomiting with same.  She reports a temperature up to 102 at home.  She denies fever today.  She is concerned that she may have diverticulitis.  She has never had diverticulitis before. She has not taken anything today for her symptoms.  Pain is worse when she eats. No reported diarrhea, vaginal discharge, urinary symptoms, or other complaint.  Of note, patient status post tubal ligation - she is certain that she is not pregnant.   The history is provided by the patient and medical records.  Abdominal Pain   This is a new problem. The current episode started more than 2 days ago. The problem occurs constantly. The problem has not changed since onset.The pain is associated with eating. The pain is located in the LLQ and RLQ. The pain is mild. Associated symptoms include fever and nausea. Pertinent negatives include anorexia. Nothing aggravates the symptoms. Nothing relieves the symptoms.    Past Medical History:  Diagnosis Date  . Anemia   . Blood transfusion without reported diagnosis     There are no active problems to display for this patient.   Past Surgical History:  Procedure Laterality Date  . DILATATION & CURETTAGE/HYSTEROSCOPY WITH MYOSURE    . HYSTEROSCOPY       OB History   None      Home Medications    Prior to Admission medications   Medication Sig Start Date End Date Taking? Authorizing Provider  traMADol (ULTRAM) 50 MG tablet Take 1 tablet (50 mg total) by mouth every 6 (six) hours as  needed. Patient not taking: Reported on 11/30/2017 04/06/17   Vanetta Mulders, MD    Family History No family history on file.  Social History Social History   Tobacco Use  . Smoking status: Never Smoker  . Smokeless tobacco: Never Used  Substance Use Topics  . Alcohol use: Yes  . Drug use: Not on file     Allergies   Latex   Review of Systems Review of Systems  Constitutional: Positive for fever.  Gastrointestinal: Positive for abdominal pain and nausea. Negative for anorexia.  All other systems reviewed and are negative.    Physical Exam Updated Vital Signs BP 130/89 (BP Location: Left Arm)   Pulse 85   Temp 99 F (37.2 C) (Oral)   Resp 18   SpO2 100%   Physical Exam  Constitutional: She is oriented to person, place, and time. She appears well-developed and well-nourished. No distress.  HENT:  Head: Normocephalic and atraumatic.  Mouth/Throat: Oropharynx is clear and moist.  Eyes: Pupils are equal, round, and reactive to light. Conjunctivae and EOM are normal.  Neck: Normal range of motion. Neck supple.  Cardiovascular: Normal rate, regular rhythm and normal heart sounds.  Pulmonary/Chest: Effort normal and breath sounds normal. No respiratory distress.  Abdominal: Soft. She exhibits no distension. There is tenderness in the right lower quadrant and left lower quadrant.  Musculoskeletal: Normal range of motion. She exhibits no edema or deformity.  Neurological: She is alert and  oriented to person, place, and time.  Skin: Skin is warm and dry.  Psychiatric: She has a normal mood and affect.  Nursing note and vitals reviewed.    ED Treatments / Results  Labs (all labs ordered are listed, but only abnormal results are displayed) Labs Reviewed  COMPREHENSIVE METABOLIC PANEL - Abnormal; Notable for the following components:      Result Value   AST 13 (*)    ALT 13 (*)    All other components within normal limits  URINALYSIS, ROUTINE W REFLEX MICROSCOPIC  - Abnormal; Notable for the following components:   Hgb urine dipstick SMALL (*)    Ketones, ur 5 (*)    All other components within normal limits  LIPASE, BLOOD  CBC  I-STAT BETA HCG BLOOD, ED ([facility], [facility], [facility] ONLY)    EKG None  Radiology Ct Abdomen Pelvis W Contrast  Result Date: 11/30/2017 CLINICAL DATA:  Abdominal pain with nausea vomiting and diarrhea. EXAM: CT ABDOMEN AND PELVIS WITH CONTRAST TECHNIQUE: Multidetector CT imaging of the abdomen and pelvis was performed using the standard protocol following bolus administration of intravenous contrast. CONTRAST:  ISOVUE-300 IOPAMIDOL (ISOVUE-300) INJECTION 61% COMPARISON:  Body CT 05/28/2017 FINDINGS: Lower chest: No acute abnormality. Hepatobiliary: No focal liver abnormality is seen. No gallstones, gallbladder wall thickening, or biliary dilatation. Pancreas: Unremarkable. No pancreatic ductal dilatation or surrounding inflammatory changes. Spleen: Normal in size without focal abnormality. Adrenals/Urinary Tract: Adrenal glands are unremarkable. Kidneys are normal, without renal calculi, focal lesion, or hydronephrosis. Bladder is unremarkable. Stomach/Bowel: Stomach is within normal limits. Appendix appears normal. No evidence of small bowel wall thickening, distention, or inflammatory changes. Scattered colonic diverticula without evidence of diverticulitis. Long segment of mild circumferential mucosal thickening of the transverse colon. Vascular/Lymphatic: No significant vascular findings are present. No enlarged abdominal or pelvic lymph nodes. Reproductive: Uterus and bilateral adnexa are unremarkable. Other: Small amount of free fluid in the pelvis. Musculoskeletal: No acute or significant osseous findings. IMPRESSION: No evidence of diverticulitis.  Scattered colonic diverticulosis. Long segment of mild circumferential mucosal thickening of the transverse colon, likely due to colitis. Small amount of free fluid in the pelvis, possibly  physiologic. Electronically Signed   By: Ted Mcalpine M.D.   On: 11/30/2017 22:28    Procedures Procedures (including critical care time)  Medications Ordered in ED Medications  ciprofloxacin (CIPRO) IVPB 400 mg (has no administration in time range)  metroNIDAZOLE (FLAGYL) IVPB 500 mg (500 mg Intravenous New Bag/Given 11/30/17 2338)  sodium chloride 0.9 % bolus 1,000 mL (0 mLs Intravenous Stopped 11/30/17 2254)  iopamidol (ISOVUE-300) 61 % injection 100 mL (100 mLs Intravenous Contrast Given 11/30/17 2214)  ondansetron (ZOFRAN) injection 4 mg (4 mg Intravenous Given 11/30/17 2334)  ketorolac (TORADOL) 30 MG/ML injection 15 mg (15 mg Intravenous Given 11/30/17 2334)     Initial Impression / Assessment and Plan / ED Course  I have reviewed the triage vital signs and the nursing notes.  Pertinent labs & imaging results that were available during my care of the patient were reviewed by me and considered in my medical decision making (see chart for details).     MDM  Screen complete  Patient is presenting with complaint of abdominal pain.  Screening labs are reassuringly normal. CT imaging suggests colitis. The patient's abdominal exam is reassuringly benign. She feels improved following ED workup and treatment. She desires DC home. She is taking PO. Close follow up instructions given and understood. Strict return precautions given and  understood.    Final Clinical Impressions(s) / ED Diagnoses   Final diagnoses:  Colitis    ED Discharge Orders        Ordered    ondansetron (ZOFRAN) 4 MG tablet  Every 6 hours     11/30/17 2349    ciprofloxacin (CIPRO) 500 MG tablet  2 times daily     11/30/17 2349    metroNIDAZOLE (FLAGYL) 500 MG tablet  2 times daily     11/30/17 2349       Wynetta Fines, MD 11/30/17 2356

## 2017-12-30 ENCOUNTER — Encounter: Payer: Self-pay | Admitting: Physician Assistant

## 2018-01-11 ENCOUNTER — Encounter: Payer: Self-pay | Admitting: Physician Assistant

## 2018-01-11 ENCOUNTER — Encounter

## 2018-01-11 ENCOUNTER — Ambulatory Visit (INDEPENDENT_AMBULATORY_CARE_PROVIDER_SITE_OTHER): Payer: Medicaid Other | Admitting: Physician Assistant

## 2018-01-11 VITALS — BP 106/62 | HR 68 | Ht 66.0 in | Wt 126.6 lb

## 2018-01-11 DIAGNOSIS — R112 Nausea with vomiting, unspecified: Secondary | ICD-10-CM

## 2018-01-11 DIAGNOSIS — R1084 Generalized abdominal pain: Secondary | ICD-10-CM

## 2018-01-11 DIAGNOSIS — R9389 Abnormal findings on diagnostic imaging of other specified body structures: Secondary | ICD-10-CM

## 2018-01-11 MED ORDER — METOCLOPRAMIDE HCL 10 MG PO TABS: 10.0000 mg | ORAL_TABLET | ORAL | 0 refills | Status: DC

## 2018-01-11 MED ORDER — ONDANSETRON 8 MG PO TBDP: 8.0000 mg | ORAL_TABLET | Freq: Four times a day (QID) | ORAL | 1 refills | Status: DC | PRN

## 2018-01-11 NOTE — Progress Notes (Signed)
Chief Complaint: Abdominal pain, diarrhea  HPI:    Christine Mccormick is a 37 year old AA female with a past medical history of anemia, who presents to clinic today, as a referral from Dr. Durene Cal with a complaint of abdominal pain and diarrhea.    11/30/2017 ED visit for abdominal pain.  At that time described diffuse lower abdominal pain for 4 to 5 days associated with nausea and vomiting as well as a temperature of 102 at home.  Labs showed a normal CMP, urinalysis, lipase, CBC and hCG.  CT showed no evidence of diverticulitis, scattered colonic diverticulosis, long segment of mild circumferential mucosal thickening of the transverse colon, likely due to colitis as well as a small amount of free fluid in the pelvis possibly physiologic.  Patient was given ciprofloxacin 500 mg twice daily, Flagyl 500 mg twice daily and Zofran.    Today, reports that she had an endometrial ablation and bilateral salpingectomy in November 2018 and it seems like ever since then she has had problems with her abdomen.  This all worsened in May when she was seen in the ER as above and diagnosed with colitis.  She took antibiotics for about 2 weeks but had no change in her symptoms.  Currently anytime she eats anything her abdomen hurts "all over" and she becomes nauseous.  She will often vomit and have diarrhea at the same time.  Typically, after the "food is out of my system" the abdominal pain will decrease slightly but is always a constant 8/10.  Tramadol helps with this pain and it also helps if she sleeps.  Also having mostly loose stools at least 3-4 times a day whenever she eats anything.  Associated symptoms include a weight loss of around 14 pounds since May.    Denies family history of IBD, hematochezia or melena or symptoms that awaken her at night.     Past Medical History:  Diagnosis Date  . Anemia   . Blood transfusion without reported diagnosis     Past Surgical History:  Procedure Laterality Date  .  DILATATION & CURETTAGE/HYSTEROSCOPY WITH MYOSURE    . HYSTEROSCOPY      Current Outpatient Medications  Medication Sig Dispense Refill  . ciprofloxacin (CIPRO) 500 MG tablet Take 1 tablet (500 mg total) by mouth 2 (two) times daily. 20 tablet 0  . metroNIDAZOLE (FLAGYL) 500 MG tablet Take 1 tablet (500 mg total) by mouth 2 (two) times daily. 14 tablet 0  . ondansetron (ZOFRAN) 4 MG tablet Take 1 tablet (4 mg total) by mouth every 6 (six) hours. 12 tablet 0  . traMADol (ULTRAM) 50 MG tablet Take 1 tablet (50 mg total) by mouth every 6 (six) hours as needed. (Patient not taking: Reported on 11/30/2017) 15 tablet 0   No current facility-administered medications for this visit.     Allergies as of 01/11/2018 - Review Complete 11/30/2017  Allergen Reaction Noted  . Latex Rash 10/15/2011    No family history on file.  Social History   Socioeconomic History  . Marital status: Single    Spouse name: Not on file  . Number of children: Not on file  . Years of education: Not on file  . Highest education level: Not on file  Occupational History  . Not on file  Social Needs  . Financial resource strain: Not on file  . Food insecurity:    Worry: Not on file    Inability: Not on file  . Transportation needs:  Medical: Not on file    Non-medical: Not on file  Tobacco Use  . Smoking status: Never Smoker  . Smokeless tobacco: Never Used  Substance and Sexual Activity  . Alcohol use: Yes  . Drug use: Not on file  . Sexual activity: Not on file  Lifestyle  . Physical activity:    Days per week: Not on file    Minutes per session: Not on file  . Stress: Not on file  Relationships  . Social connections:    Talks on phone: Not on file    Gets together: Not on file    Attends religious service: Not on file    Active member of club or organization: Not on file    Attends meetings of clubs or organizations: Not on file    Relationship status: Not on file  . Intimate partner  violence:    Fear of current or ex partner: Not on file    Emotionally abused: Not on file    Physically abused: Not on file    Forced sexual activity: Not on file  Other Topics Concern  . Not on file  Social History Narrative  . Not on file    Review of Systems:    Constitutional: No weight loss, fever or chills Skin: No rash  Cardiovascular: No chest pain Respiratory: No SOB  Gastrointestinal: See HPI and otherwise negative Genitourinary: No dysuria Neurological: No headache, dizziness or syncope Musculoskeletal: No new muscle or joint pain Hematologic: No bleeding  Psychiatric: No history of depression or anxiety   Physical Exam:  Vital signs: BP 106/62   Pulse 68   Ht 5\' 6"  (1.676 m)   Wt 126 lb 9.6 oz (57.4 kg)   BMI 20.43 kg/m   Constitutional:   Pleasant AA female appears to be in NAD, Well developed, Well nourished, alert and cooperative Head:  Normocephalic and atraumatic. Eyes:   PEERL, EOMI. No icterus. Conjunctiva pink. Ears:  Normal auditory acuity. Neck:  Supple Throat: Oral cavity and pharynx without inflammation, swelling or lesion.  Respiratory: Respirations even and unlabored. Lungs clear to auscultation bilaterally.   No wheezes, crackles, or rhonchi.  Cardiovascular: Normal S1, S2. No MRG. Regular rate and rhythm. No peripheral edema, cyanosis or pallor.  Gastrointestinal:  Soft, nondistended, moderate generalized ttp with some involuntary guarding. Normal bowel sounds. No appreciable masses or hepatomegaly. Rectal:  Not performed.  Msk:  Symmetrical without gross deformities. Without edema, no deformity or joint abnormality.  Neurologic:  Alert and  oriented x4;  grossly normal neurologically.  Skin:   Dry and intact without significant lesions or rashes. Psychiatric: Demonstrates good judgement and reason without abnormal affect or behaviors.  RELEVANT LABS AND IMAGING: CBC    Component Value Date/Time   WBC 7.3 11/30/2017 1831   RBC 4.18  11/30/2017 1831   HGB 12.9 11/30/2017 1831   HCT 37.6 11/30/2017 1831   PLT 281 11/30/2017 1831   MCV 90.0 11/30/2017 1831   MCH 30.9 11/30/2017 1831   MCHC 34.3 11/30/2017 1831   RDW 13.4 11/30/2017 1831   LYMPHSABS 1.7 04/06/2017 1045   MONOABS 0.6 04/06/2017 1045   EOSABS 0.1 04/06/2017 1045   BASOSABS 0.0 04/06/2017 1045    CMP     Component Value Date/Time   NA 137 11/30/2017 1831   K 4.1 11/30/2017 1831   CL 105 11/30/2017 1831   CO2 24 11/30/2017 1831   GLUCOSE 91 11/30/2017 1831   BUN 9 11/30/2017 1831  CREATININE 0.68 11/30/2017 1831   CALCIUM 9.1 11/30/2017 1831   PROT 7.6 11/30/2017 1831   ALBUMIN 3.9 11/30/2017 1831   AST 13 (L) 11/30/2017 1831   ALT 13 (L) 11/30/2017 1831   ALKPHOS 52 11/30/2017 1831   BILITOT 0.6 11/30/2017 1831   GFRNONAA >60 11/30/2017 1831   GFRAA >60 11/30/2017 1831    Assessment: 1.  Abdominal pain: Generalized, worse after eating, constant 8/10, CT showing colitis, no help from antibiotics also associated with nausea and vomiting; consider IBD versus other 2.  Abnormal CT of the abdomen 3.  Nausea and vomiting  Plan: 1.  Scheduled patient for a colonoscopy in the [facility] with Dr. Christella HartiganJacobs next week.  Did discuss risk, benefits, limitations and alternatives and the patient agrees to proceed. 2.  Refilled patient's Zofran 8 mg every 6 hours #30 with 1 refill 3.  Prescribed Reglan 10 mg to be given with prep x2 4.  Patient may continue her Tramadol to help with abdominal pain.  She still has some of this at home. 5.  Patient to follow in clinic per recommendations from Dr. Christella HartiganJacobs after time of procedure.  Hyacinth MeekerJennifer Kallon Caylor, PA-C [facility] 01/11/2018, 9:51 AM

## 2018-01-11 NOTE — Patient Instructions (Signed)
We have sent the following medications to your pharmacy for you to pick up at your convenience: Zofran 8 mg every 6 hours   Use Reglan 10 mg 30-60 minutes before you begin your colonoscopy prep.   You have been scheduled for a colonoscopy. Please follow written instructions given to you at your visit today.  Please pick up your prep supplies at the pharmacy within the next 1-3 days. If you use inhalers (even only as needed), please bring them with you on the day of your procedure. Your physician has requested that you go to www.startemmi.com and enter the access code given to you at your visit today. This web site gives a general overview about your procedure. However, you should still follow specific instructions given to you by our office regarding your preparation for the procedure.

## 2018-01-11 NOTE — Progress Notes (Signed)
I agree with the above note, plan 

## 2018-01-13 ENCOUNTER — Other Ambulatory Visit: Payer: Self-pay | Admitting: Physician Assistant

## 2018-01-13 ENCOUNTER — Telehealth: Payer: Self-pay | Admitting: Gastroenterology

## 2018-01-13 DIAGNOSIS — R112 Nausea with vomiting, unspecified: Secondary | ICD-10-CM

## 2018-01-13 DIAGNOSIS — R1084 Generalized abdominal pain: Secondary | ICD-10-CM

## 2018-01-13 MED ORDER — PEG 3350-KCL-NA BICARB-NACL 420 G PO SOLR: 4000.0000 mL | ORAL | 0 refills | Status: DC

## 2018-01-13 NOTE — Telephone Encounter (Signed)
Prep sent to [facility] patient notified

## 2018-01-17 ENCOUNTER — Telehealth: Payer: Self-pay | Admitting: Gastroenterology

## 2018-01-17 NOTE — Telephone Encounter (Signed)
Returned patients call regarding prep. Patient states that she has had nausea and vomiting since she took the Dulcolax. I tried to contact Dr. Christella HartiganJacobs but he had left for the day. I spoke with Dr. Leone PayorGessner who is the Doc of the day. Dr. Leone PayorGessner suggested that she may give it a little time to see if the nausea and vomiting eases off before beginning the prep. He also said if the patient is unable to keep the prep down, she could reschedule and try a different prep.     Sampson SiLatierra states that if at all possible she wanted to try to go through with her procedure because she wants to find out what is causing her symptoms. All suggestions were passed on to her. I also suggested using a straw to drink the prep. Sampson SiLatierra mentioned that she had Reglan at home, and I told her to take it 30 minutes prior to drinking each part of the prep. Patient states that she will call in the morning if she was not able to complete the prep.   Janalee DaneNancy Aerik Polan, LPN ( Admitting )

## 2018-01-18 ENCOUNTER — Encounter: Payer: Self-pay | Admitting: Gastroenterology

## 2018-01-18 ENCOUNTER — Other Ambulatory Visit: Payer: Self-pay

## 2018-01-18 ENCOUNTER — Ambulatory Visit (AMBULATORY_SURGERY_CENTER): Payer: Medicaid Other | Admitting: Gastroenterology

## 2018-01-18 VITALS — BP 118/86 | HR 88 | Temp 97.1°F | Resp 14 | Ht 66.0 in | Wt 126.0 lb

## 2018-01-18 DIAGNOSIS — K633 Ulcer of intestine: Secondary | ICD-10-CM

## 2018-01-18 DIAGNOSIS — R112 Nausea with vomiting, unspecified: Secondary | ICD-10-CM

## 2018-01-18 DIAGNOSIS — K639 Disease of intestine, unspecified: Secondary | ICD-10-CM

## 2018-01-18 DIAGNOSIS — R9389 Abnormal findings on diagnostic imaging of other specified body structures: Secondary | ICD-10-CM

## 2018-01-18 MED ORDER — SODIUM CHLORIDE 0.9 % IV SOLN: 500.0000 mL | Freq: Once | INTRAVENOUS | Status: DC

## 2018-01-18 NOTE — Patient Instructions (Signed)
YOU HAD AN ENDOSCOPIC PROCEDURE TODAY AT THE [facility]:   Refer to the procedure report that was given to you for any specific questions about what was found during the examination.  If the procedure report does not answer your questions, please call your gastroenterologist to clarify.  If you requested that your care partner not be given the details of your procedure findings, then the procedure report has been included in a sealed envelope for you to review at your convenience later.  YOU SHOULD EXPECT: Some feelings of bloating in the abdomen. Passage of more gas than usual.  Walking can help get rid of the air that was put into your GI tract during the procedure and reduce the bloating. If you had a lower endoscopy (such as a colonoscopy or flexible sigmoidoscopy) you may notice spotting of blood in your stool or on the toilet paper. If you underwent a bowel prep for your procedure, you may not have a normal bowel movement for a few days.  Please Note:  You might notice some irritation and congestion in your nose or some drainage.  This is from the oxygen used during your procedure.  There is no need for concern and it should clear up in a day or so.  SYMPTOMS TO REPORT IMMEDIATELY:   Following lower endoscopy (colonoscopy or flexible sigmoidoscopy):  Excessive amounts of blood in the stool  Significant tenderness or worsening of abdominal pains  Swelling of the abdomen that is new, acute  Fever of 100F or higher  For urgent or emergent issues, a gastroenterologist can be reached at any hour by calling [phone].   DIET:  We do recommend a small meal at first, but then you may proceed to your regular diet.  Drink plenty of fluids but you should avoid alcoholic beverages for 24 hours.  ACTIVITY:  You should plan to take it easy for the rest of today and you should NOT DRIVE or use heavy machinery until tomorrow (because of the sedation medicines used during the test).     FOLLOW UP: Our staff will call the number listed on your records the next business day following your procedure to check on you and address any questions or concerns that you may have regarding the information given to you following your procedure. If we do not reach you, we will leave a message.  However, if you are feeling well and you are not experiencing any problems, there is no need to return our call.  We will assume that you have returned to your regular daily activities without incident.  If any biopsies were taken you will be contacted by phone or by letter within the next 1-3 weeks.  Please call us at [phone] if you have not heard about the biopsies in 3 weeks.    SIGNATURES/CONFIDENTIALITY: You and/or your care partner have signed paperwork which will be entered into your electronic medical record.  These signatures attest to the fact that that the information above on your After Visit Summary has been reviewed and is understood.  Full responsibility of the confidentiality of this discharge information lies with you and/or your care-partner. 

## 2018-01-18 NOTE — Op Note (Signed)
[facility] Patient Name: Christine Mccormick Procedure Date: 01/18/2018 10:11 AM MRN: [id number] Endoscopist: Rachael Feeaniel P Jacobs , MD Age: [age] Referring MD:  Date of Birth: 01/09/1981 Gender: Female Account #: [id number] Procedure:                Colonoscopy Indications:              Generalized abdominal pain, Clinically significant                            diarrhea of unexplained origin; abnormal colon on                            CT, nausea/vomiting, weight loss. Medicines:                Monitored Anesthesia Care Procedure:                Pre-Anesthesia Assessment:                           - Prior to the procedure, a History and Physical                            was performed, and patient medications and                            allergies were reviewed. The patient's tolerance of                            previous anesthesia was also reviewed. The risks                            and benefits of the procedure and the sedation                            options and risks were discussed with the patient.                            All questions were answered, and informed consent                            was obtained. Prior Anticoagulants: The patient has                            taken no previous anticoagulant or antiplatelet                            agents. ASA Grade Assessment: II - A patient with                            mild systemic disease. After reviewing the risks                            and benefits, the patient was deemed in  satisfactory condition to undergo the procedure.                           After obtaining informed consent, the colonoscope                            was passed under direct vision. Throughout the                            procedure, the patient's blood pressure, pulse, and                            oxygen saturations were monitored continuously. The                            Colonoscope was  introduced through the anus and                            advanced to the the terminal ileum. The colonoscopy                            was performed without difficulty. The patient                            tolerated the procedure well. The quality of the                            bowel preparation was good. The terminal ileum,                            ileocecal valve, appendiceal orifice, and rectum                            were photographed. Scope In: 10:21:17 AM Scope Out: 10:32:24 AM Scope Withdrawal Time: 0 hours 7 minutes 22 seconds  Total Procedure Duration: 0 hours 11 minutes 7 seconds  Findings:                 The terminal ileum appeared normal.                           There was very mild erythema and a few erosions in                            the left colon without a distinct transition to                            more normal looking mucosa in the right colon.                            Right and left colon sample with biopsy.                           The exam was otherwise without abnormality on  direct and retroflexion views. Complications:            No immediate complications. Estimated blood loss:                            None. Estimated Blood Loss:     Estimated blood loss: none. Impression:               - The examined portion of the ileum was normal.                           - Possible mild left sided colitis. Biopsies taken                            from right and left colon.                           - The examination was otherwise normal on direct                            and retroflexion views. Recommendation:           - Patient has a contact number available for                            emergencies. The signs and symptoms of potential                            delayed complications were discussed with the                            patient. Return to normal activities tomorrow.                            Written  discharge instructions were provided to the                            patient.                           - Resume previous diet.                           - Continue present medications.                           - Await pathology results. If the biospies from                            this examination do not adequately explain your 2-3                            months of symptoms then you will likely need                            further testing (EGD, other imaging?). Rachael Fee, MD 01/18/2018 10:38:13 AM  This report has been signed electronically.

## 2018-01-18 NOTE — Progress Notes (Signed)
Report to PACU, RN, vss, BBS= Clear.  

## 2018-01-18 NOTE — Progress Notes (Signed)
Called to room to assist during endoscopic procedure.  Patient ID and intended procedure confirmed with present staff. Received instructions for my participation in the procedure from the performing physician.  

## 2018-01-19 ENCOUNTER — Telehealth: Payer: Self-pay | Admitting: *Deleted

## 2018-01-19 NOTE — Telephone Encounter (Signed)
  Follow up Call-  Call back number 01/18/2018  Post procedure Call Back phone  # [phone]  Permission to leave phone message Yes     Patient questions:  Do you have a fever, pain , or abdominal swelling? No. Pain Score  0 *  Have you tolerated food without any problems? No.  Have you been able to return to your normal activities? Yes.    Do you have any questions about your discharge instructions: Diet   Yes.   Medications  No. Follow up visit  No.  Do you have questions or concerns about your Care? Yes.    Actions: * If pain score is 4 or above: No action needed, pain <4.

## 2018-01-24 ENCOUNTER — Telehealth: Payer: Self-pay | Admitting: Gastroenterology

## 2018-01-24 ENCOUNTER — Other Ambulatory Visit (INDEPENDENT_AMBULATORY_CARE_PROVIDER_SITE_OTHER): Payer: Self-pay

## 2018-01-24 ENCOUNTER — Other Ambulatory Visit: Payer: Self-pay

## 2018-01-24 ENCOUNTER — Other Ambulatory Visit: Payer: Medicaid Other

## 2018-01-24 DIAGNOSIS — R112 Nausea with vomiting, unspecified: Secondary | ICD-10-CM

## 2018-01-24 LAB — COMPREHENSIVE METABOLIC PANEL
ALT: 9 U/L (ref 0–35)
AST: 12 U/L (ref 0–37)
Albumin: 4.4 g/dL (ref 3.5–5.2)
Alkaline Phosphatase: 51 U/L (ref 39–117)
BILIRUBIN TOTAL: 0.3 mg/dL (ref 0.2–1.2)
BUN: 6 mg/dL (ref 6–23)
CO2: 29 mEq/L (ref 19–32)
Calcium: 9.3 mg/dL (ref 8.4–10.5)
Chloride: 104 mEq/L (ref 96–112)
Creatinine, Ser: 0.71 mg/dL (ref 0.40–1.20)
GFR: 119.07 mL/min (ref >60.00)
GLUCOSE: 90 mg/dL (ref 70–99)
Potassium: 4 mEq/L (ref 3.5–5.1)
SODIUM: 139 meq/L (ref 135–145)
Total Protein: 7.8 g/dL (ref 6.0–8.3)

## 2018-01-24 LAB — CBC WITH DIFFERENTIAL/PLATELET
Basophils Absolute: 0.1 10*3/uL (ref 0.0–0.1)
Basophils Relative: 1.2 % (ref 0.0–3.0)
Eosinophils Absolute: 0.1 10*3/uL (ref 0.0–0.7)
Eosinophils Relative: 1.8 % (ref 0.0–5.0)
HEMATOCRIT: 38.9 % (ref 36.0–46.0)
HEMOGLOBIN: 13.2 g/dL (ref 12.0–15.0)
Lymphocytes Relative: 36.9 % (ref 12.0–46.0)
Lymphs Abs: 1.6 10*3/uL (ref 0.7–4.0)
MCHC: 34 g/dL (ref 30.0–36.0)
MCV: 91.6 fl (ref 78.0–100.0)
MONO ABS: 0.5 10*3/uL (ref 0.1–1.0)
MONOS PCT: 10.8 % (ref 3.0–12.0)
Neutro Abs: 2.2 10*3/uL (ref 1.4–7.7)
Neutrophils Relative %: 49.3 % (ref 43.0–77.0)
Platelets: 272 10*3/uL (ref 150.0–400.0)
RBC: 4.25 Mil/uL (ref 3.87–5.11)
RDW: 13.3 % (ref 11.5–15.5)
WBC: 4.4 10*3/uL (ref 4.0–10.5)

## 2018-01-24 NOTE — Telephone Encounter (Signed)
The pt has completed labs and stool test.  She had a essentially normal colon on 01/18/18.  We will call her with results as soon as available.

## 2018-01-25 LAB — HELICOBACTER PYLORI  SPECIAL ANTIGEN
MICRO NUMBER:: 90840942
SPECIMEN QUALITY: ADEQUATE

## 2018-01-25 LAB — PREGNANCY, URINE: PREG TEST UR: NEGATIVE

## 2018-03-15 ENCOUNTER — Other Ambulatory Visit: Payer: Self-pay

## 2018-03-15 ENCOUNTER — Ambulatory Visit (AMBULATORY_SURGERY_CENTER): Payer: Self-pay | Admitting: *Deleted

## 2018-03-15 VITALS — Ht 66.0 in | Wt 122.2 lb

## 2018-03-15 DIAGNOSIS — R112 Nausea with vomiting, unspecified: Secondary | ICD-10-CM

## 2018-03-15 NOTE — Progress Notes (Signed)
No egg or soy allergy known to patient  No issues with past sedation with any surgeries  or procedures, no intubation problems  No diet pills per patient No home 02 use per patient  No blood thinners per patient  No A fib or A flutter  EMMI video offered and patient declined 

## 2018-03-22 ENCOUNTER — Encounter: Payer: Medicaid Other | Admitting: Gastroenterology

## 2018-03-24 ENCOUNTER — Encounter: Payer: Self-pay | Admitting: Gastroenterology

## 2018-03-24 ENCOUNTER — Ambulatory Visit (AMBULATORY_SURGERY_CENTER): Payer: Self-pay | Admitting: Gastroenterology

## 2018-03-24 VITALS — BP 119/83 | HR 81 | Temp 96.4°F | Resp 13 | Ht 66.0 in | Wt 122.0 lb

## 2018-03-24 DIAGNOSIS — K2970 Gastritis, unspecified, without bleeding: Secondary | ICD-10-CM

## 2018-03-24 DIAGNOSIS — R112 Nausea with vomiting, unspecified: Secondary | ICD-10-CM

## 2018-03-24 DIAGNOSIS — K2990 Gastroduodenitis, unspecified, without bleeding: Secondary | ICD-10-CM

## 2018-03-24 MED ORDER — SODIUM CHLORIDE 0.9 % IV SOLN: 500.0000 mL | Freq: Once | INTRAVENOUS | Status: DC

## 2018-03-24 NOTE — Progress Notes (Signed)
Report to PACU, RN, vss, BBS= Clear.  

## 2018-03-24 NOTE — Progress Notes (Signed)
Called to room to assist during endoscopic procedure.  Patient ID and intended procedure confirmed with present staff. Received instructions for my participation in the procedure from the performing physician.  

## 2018-03-24 NOTE — Op Note (Signed)
[facility] Patient Name: Carollee HerterLatierra Stitzer Procedure Date: 03/24/2018 9:05 AM MRN: [id number] Endoscopist: Rachael Feeaniel P Skylen Spiering , MD Age: [age] Referring MD:  Date of Birth: 03-13-1981 Gender: Female Account #: [id number] Procedure:                Upper GI endoscopy Indications:              Dysphagia, Nausea with vomiting Medicines:                Monitored Anesthesia Care Procedure:                Pre-Anesthesia Assessment:                           - Prior to the procedure, a History and Physical                            was performed, and patient medications and                            allergies were reviewed. The patient's tolerance of                            previous anesthesia was also reviewed. The risks                            and benefits of the procedure and the sedation                            options and risks were discussed with the patient.                            All questions were answered, and informed consent                            was obtained. Prior Anticoagulants: The patient has                            taken no previous anticoagulant or antiplatelet                            agents. ASA Grade Assessment: II - A patient with                            mild systemic disease. After reviewing the risks                            and benefits, the patient was deemed in                            satisfactory condition to undergo the procedure.                           After obtaining informed consent, the endoscope was  passed under direct vision. Throughout the                            procedure, the patient's blood pressure, pulse, and                            oxygen saturations were monitored continuously. The                            Endoscope was introduced through the mouth, and                            advanced to the second part of duodenum. The upper                            GI endoscopy was  accomplished without difficulty.                            The patient tolerated the procedure well. Scope In: Scope Out: Findings:                 Mild inflammation characterized by erythema and                            friability was found in the gastric antrum.                            Biopsies were taken with a cold forceps for                            histology.                           The exam was otherwise without abnormality. Complications:            No immediate complications. Estimated blood loss:                            None. Estimated Blood Loss:     Estimated blood loss: none. Impression:               - Gastritis. Biopsied.                           - The examination was otherwise normal. Recommendation:           - Patient has a contact number available for                            emergencies. The signs and symptoms of potential                            delayed complications were discussed with the                            patient. Return to normal activities tomorrow.  Written discharge instructions were provided to the                            patient.                           - Resume previous diet.                           - Continue present medications.                           - Await pathology results. Rachael Fee, MD 03/24/2018 9:22:45 AM This report has been signed electronically.

## 2018-03-24 NOTE — Patient Instructions (Signed)
Handout given on gastritis.    YOU HAD AN ENDOSCOPIC PROCEDURE TODAY AT THE [facility]:   Refer to the procedure report that was given to you for any specific questions about what was found during the examination.  If the procedure report does not answer your questions, please call your gastroenterologist to clarify.  If you requested that your care partner not be given the details of your procedure findings, then the procedure report has been included in a sealed envelope for you to review at your convenience later.  YOU SHOULD EXPECT: Some feelings of bloating in the abdomen. Passage of more gas than usual.  Walking can help get rid of the air that was put into your GI tract during the procedure and reduce the bloating. If you had a lower endoscopy (such as a colonoscopy or flexible sigmoidoscopy) you may notice spotting of blood in your stool or on the toilet paper. If you underwent a bowel prep for your procedure, you may not have a normal bowel movement for a few days.  Please Note:  You might notice some irritation and congestion in your nose or some drainage.  This is from the oxygen used during your procedure.  There is no need for concern and it should clear up in a day or so.  SYMPTOMS TO REPORT IMMEDIATELY:   Following upper endoscopy (EGD)  Vomiting of blood or coffee ground material  New chest pain or pain under the shoulder blades  Painful or persistently difficult swallowing  New shortness of breath  Fever of 100F or higher  Black, tarry-looking stools  For urgent or emergent issues, a gastroenterologist can be reached at any hour by calling [phone].   DIET:  We do recommend a small meal at first, but then you may proceed to your regular diet.  Drink plenty of fluids but you should avoid alcoholic beverages for 24 hours.  ACTIVITY:  You should plan to take it easy for the rest of today and you should NOT DRIVE or use heavy machinery until tomorrow  (because of the sedation medicines used during the test).    FOLLOW UP: Our staff will call the number listed on your records the next business day following your procedure to check on you and address any questions or concerns that you may have regarding the information given to you following your procedure. If we do not reach you, we will leave a message.  However, if you are feeling well and you are not experiencing any problems, there is no need to return our call.  We will assume that you have returned to your regular daily activities without incident.  If any biopsies were taken you will be contacted by phone or by letter within the next 1-3 weeks.  Please call us at [phone] if you have not heard about the biopsies in 3 weeks.    SIGNATURES/CONFIDENTIALITY: You and/or your care partner have signed paperwork which will be entered into your electronic medical record.  These signatures attest to the fact that that the information above on your After Visit Summary has been reviewed and is understood.  Full responsibility of the confidentiality of this discharge information lies with you and/or your care-partner. 

## 2018-03-24 NOTE — Progress Notes (Signed)
Pt's states no medical or surgical changes since previsit or office visit. 

## 2018-03-27 ENCOUNTER — Telehealth: Payer: Self-pay

## 2018-03-27 NOTE — Telephone Encounter (Signed)
  Follow up Call-  Call Jermal Dismuke number 03/24/2018 01/18/2018  Post procedure Call Kysha Muralles phone  # [phone]  Permission to leave phone message Yes Yes     Patient questions:  Do you have a fever, pain , or abdominal swelling? No. Pain Score  0 *  Have you tolerated food without any problems? Yes.    Have you been able to return to your normal activities? Yes.    Do you have any questions about your discharge instructions: Diet   No. Medications  No. Follow up visit  No.  Do you have questions or concerns about your Care? No.  Actions: * If pain score is 4 or above: No action needed, pain <4.

## 2018-04-03 ENCOUNTER — Encounter: Payer: Self-pay | Admitting: Gastroenterology

## 2018-04-10 ENCOUNTER — Telehealth: Payer: Self-pay | Admitting: Gastroenterology

## 2018-04-10 NOTE — Telephone Encounter (Signed)
The pt has been scheduled for 10/2 to discuss further symptoms

## 2018-04-10 NOTE — Telephone Encounter (Signed)
[name] [address] [place] [address]   Dear Ms. Rookstool,  The biopsies taken during your recent upper endoscopy showed no sign of infection or cancer.  You should continue to follow the recommendations that we discussed at the time of your procedure.   If you have any questions or concerns, please don't hesitate to call.  Sincerely,    Rachael Fee, MD

## 2018-04-12 ENCOUNTER — Ambulatory Visit (INDEPENDENT_AMBULATORY_CARE_PROVIDER_SITE_OTHER): Payer: Medicaid Other | Admitting: Gastroenterology

## 2018-04-12 ENCOUNTER — Other Ambulatory Visit: Payer: Medicaid Other

## 2018-04-12 ENCOUNTER — Other Ambulatory Visit (INDEPENDENT_AMBULATORY_CARE_PROVIDER_SITE_OTHER): Payer: Self-pay

## 2018-04-12 ENCOUNTER — Encounter: Payer: Self-pay | Admitting: Gastroenterology

## 2018-04-12 VITALS — BP 114/60 | HR 66 | Ht 66.0 in | Wt 122.5 lb

## 2018-04-12 DIAGNOSIS — R112 Nausea with vomiting, unspecified: Secondary | ICD-10-CM

## 2018-04-12 DIAGNOSIS — R070 Pain in throat: Secondary | ICD-10-CM

## 2018-04-12 LAB — CBC WITH DIFFERENTIAL/PLATELET
Basophils Absolute: 0.1 10*3/uL (ref 0.0–0.1)
Basophils Relative: 1.4 % (ref 0.0–3.0)
EOS PCT: 1.7 % (ref 0.0–5.0)
Eosinophils Absolute: 0.1 10*3/uL (ref 0.0–0.7)
HCT: 38.6 % (ref 36.0–46.0)
HEMOGLOBIN: 13.1 g/dL (ref 12.0–15.0)
Lymphocytes Relative: 29.8 % (ref 12.0–46.0)
Lymphs Abs: 1.7 10*3/uL (ref 0.7–4.0)
MCHC: 34 g/dL (ref 30.0–36.0)
MCV: 91.4 fl (ref 78.0–100.0)
MONOS PCT: 9.8 % (ref 3.0–12.0)
Monocytes Absolute: 0.6 10*3/uL (ref 0.1–1.0)
Neutro Abs: 3.3 10*3/uL (ref 1.4–7.7)
Neutrophils Relative %: 57.3 % (ref 43.0–77.0)
Platelets: 280 10*3/uL (ref 150.0–400.0)
RBC: 4.23 Mil/uL (ref 3.87–5.11)
RDW: 13.9 % (ref 11.5–15.5)
WBC: 5.8 10*3/uL (ref 4.0–10.5)

## 2018-04-12 LAB — TSH: TSH: 0.42 u[IU]/mL (ref 0.35–4.50)

## 2018-04-12 LAB — COMPREHENSIVE METABOLIC PANEL
ALBUMIN: 4.3 g/dL (ref 3.5–5.2)
ALT: 8 U/L (ref 0–35)
AST: 11 U/L (ref 0–37)
Alkaline Phosphatase: 55 U/L (ref 39–117)
BUN: 7 mg/dL (ref 6–23)
CO2: 28 mEq/L (ref 19–32)
CREATININE: 0.68 mg/dL (ref 0.40–1.20)
Calcium: 9.3 mg/dL (ref 8.4–10.5)
Chloride: 105 mEq/L (ref 96–112)
GFR: 125 mL/min (ref >60.00)
Glucose, Bld: 79 mg/dL (ref 70–99)
POTASSIUM: 4.2 meq/L (ref 3.5–5.1)
SODIUM: 138 meq/L (ref 135–145)
TOTAL PROTEIN: 7.7 g/dL (ref 6.0–8.3)
Total Bilirubin: 0.4 mg/dL (ref 0.2–1.2)

## 2018-04-12 LAB — SEDIMENTATION RATE: Sed Rate: 14 mm/hr (ref 0–20)

## 2018-04-12 NOTE — Progress Notes (Signed)
EGD September 2019 for dysphasia, nausea and vomiting.  Found mild gastritis, biopsies showed no H. Pylori.   Colonoscopy July 2019 found normal terminal ileum.  Mild erythema in the left colon.  Otherwise normal examination.  Biopsies from right and left colon were all essentially normal  Blood work July 2019 normal CBC, normal complete metabolic profile  Stool testing July 2019- for H. Pylori   CT scan September 2018 abdomen pelvis with IV and oral contrast; abdomen normal except for centimeter lesion at the cervix felt to be uterine fibroid  CT scan November 2018 showed no PE, there was some small amount of free intraperitoneal air felt to reflect recent endometrial ablation.  Otherwise normal.  Diverticulosis without diverticulitis in the colon  CT scan May 2019 IV and oral contrast abdomen pelvis no diverticulitis.  There was a "long segment of mild circumferential mucosal thickening of the transverse colon, likely due to colitis"    HPI: This is a very pleasant 37 year old woman whom I last saw the time of an upper endoscopy last month.  See those results summarized above.  Throat "a little tender since EGD"  Was doing well, eating better, bowels moving fine. Then 5 days ago diarrhea, abd pain, vomiting all started.  She has lost 4 pounds since her last office visit here 4 months ago  She tells me her primary care physician had told her that her thyroid testing was abnormal but never got back to her about any further details.  Chief complaint is abdominal pain, diarrhea, vomiting  ROS: complete GI ROS as described in HPI, all other review negative.  Constitutional:  No unintentional weight loss   Past Medical History:  Diagnosis Date  . Anemia   . Blood transfusion without reported diagnosis   . Colitis   . Concussion    x2  . Diverticulosis   . Glaucoma   . Heart murmur   . Hypertension     Past Surgical History:  Procedure Laterality Date  . abalation    .  COLONOSCOPY    . CYST EXCISION     left wrist  . DILATATION & CURETTAGE/HYSTEROSCOPY WITH MYOSURE    . HYSTEROSCOPY    . ROTATOR CUFF REPAIR     left shoulder  . TUBAL LIGATION      Current Outpatient Medications  Medication Sig Dispense Refill  . ondansetron (ZOFRAN ODT) 8 MG disintegrating tablet Take 1 tablet (8 mg total) by mouth every 6 (six) hours as needed for nausea or vomiting. 30 tablet 1  . Simethicone (GAS RELIEF) 180 MG CAPS Take 1 capsule by mouth as needed.     No current facility-administered medications for this visit.     Allergies as of 04/12/2018 - Review Complete 04/12/2018  Allergen Reaction Noted  . Latex Rash 10/15/2011    Family History  Problem Relation Age of Onset  . Kidney disease Father   . Colon cancer Neg Hx   . Stomach cancer Neg Hx   . Esophageal cancer Neg Hx   . Colon polyps Neg Hx   . Rectal cancer Neg Hx     Social History   Socioeconomic History  . Marital status: Single    Spouse name: Not on file  . Number of children: 2  . Years of education: Not on file  . Highest education level: Not on file  Occupational History  . Occupation: unemployed  Social Needs  . Financial resource strain: Not on file  . Food  insecurity:    Worry: Not on file    Inability: Not on file  . Transportation needs:    Medical: Not on file    Non-medical: Not on file  Tobacco Use  . Smoking status: Never Smoker  . Smokeless tobacco: Never Used  Substance and Sexual Activity  . Alcohol use: Yes    Comment: holidays  . Drug use: Never  . Sexual activity: Not on file  Lifestyle  . Physical activity:    Days per week: Not on file    Minutes per session: Not on file  . Stress: Not on file  Relationships  . Social connections:    Talks on phone: Not on file    Gets together: Not on file    Attends religious service: Not on file    Active member of club or organization: Not on file    Attends meetings of clubs or organizations: Not on file     Relationship status: Not on file  . Intimate partner violence:    Fear of current or ex partner: Not on file    Emotionally abused: Not on file    Physically abused: Not on file    Forced sexual activity: Not on file  Other Topics Concern  . Not on file  Social History Narrative  . Not on file     Physical Exam: BP 114/60   Pulse 66   Ht 5\' 6"  (1.676 m)   Wt 122 lb 8 oz (55.6 kg)   BMI 19.77 kg/m  Constitutional: generally well-appearing Neck examination: Fullness bilaterally in her neck that I have not noticed before but she tells me is chronic.  No particular masses but has overall appearance of a goiter Psychiatric: alert and oriented x3 Abdomen: soft, nontender, nondistended, no obvious ascites, no peritoneal signs, normal bowel sounds No peripheral edema noted in lower extremities  Assessment and plan: 37 y.o. female with intermittent abdominal pains, diarrhea, dysphasia, nausea vomiting, weight loss  She has had numerous specific GI testing including EGD, colonoscopy, CT scans of her abdomen and pelvis.  CBC has been normal, complete metabolic profile has been normal.  It is not clear to me what is going on with her but she has lost even a little more weight.  It does seem that her neck is a bit full.  This is new to me but she tells me it is actually chronic.  She also tells me that her thyroid level was abnormal by her primary care physician but she could get no further details from that physician and she has not seen her since.  I wonder about a possible metabolic problem here.  I am not repeat some blood work today including a CBC, complete metabolic profile, TSH, sedimentation rate, autoimmune marker, also a thyroid, neck ultrasound.  Please see the "Patient Instructions" section for addition details about the plan.  Rob Bunting, MD [facility] 04/12/2018, 2:30 PM

## 2018-04-12 NOTE — Patient Instructions (Addendum)
You will have labs checked today in the basement lab.  Please head down after you check out with the front desk  (cbc, cmet, ESR, TSH, ANA).   Thyroid/ neck US.  You have been scheduled for a Thyroid/neck ultrasound at [facility] (1st floor of hospital) on 104/19 at 11am. Please arrive 15 minutes prior to your appointment for registration. Make certain not to have anything to eat or drink 6 hours prior to your appointment. Should you need to reschedule your appointment, please contact radiology at [phone]. This test typically takes about 30 minutes to perform.  Your provider has requested that you go to the basement level for lab work before leaving today. Press "B" on the elevator. The lab is located at the first door on the left as you exit the elevator.  Thank you for entrusting me with your care and choosing [facility].  Dr Ardis Hughs

## 2018-04-13 LAB — ANA: Anti Nuclear Antibody(ANA): NEGATIVE

## 2018-04-14 ENCOUNTER — Ambulatory Visit (HOSPITAL_COMMUNITY): Payer: Medicaid Other

## 2018-07-24 ENCOUNTER — Encounter (HOSPITAL_COMMUNITY): Payer: Self-pay | Admitting: *Deleted

## 2018-07-24 ENCOUNTER — Other Ambulatory Visit: Payer: Self-pay

## 2018-07-24 ENCOUNTER — Emergency Department (HOSPITAL_COMMUNITY)
Admission: EM | Admit: 2018-07-24 | Discharge: 2018-07-24 | Disposition: A | Discharge status: Home / Self Care | Payer: Medicaid Other | Attending: Emergency Medicine | Admitting: Emergency Medicine

## 2018-07-24 DIAGNOSIS — R51 Headache: Secondary | ICD-10-CM | POA: Insufficient documentation

## 2018-07-24 DIAGNOSIS — Z5321 Procedure and treatment not carried out due to patient leaving prior to being seen by health care provider: Secondary | ICD-10-CM | POA: Diagnosis not present

## 2018-07-24 NOTE — ED Notes (Signed)
Pt stated she is not going to wait for a room, she will go somewhere else. Encouraged pt to stay, she elected to leave, in return was instructed to return if symptoms worsen.

## 2018-07-24 NOTE — ED Triage Notes (Signed)
Pt states she has been to her doctor several times and tried multiple meds without relief. Difficult to assess level of pain and causes.

## 2018-07-24 NOTE — ED Notes (Signed)
Bed: [facility] Expected date:  Expected time:  Means of arrival:  Comments: 

## 2018-09-26 IMAGING — CT CT ABD-PELV W/ CM
2 of 4 series · 16 of 46 positions shown, 18 images · IV contrast (iopamidol)
Comparison: Body CT 05/28/2017

CLINICAL DATA: Abdominal pain with nausea vomiting and diarrhea.

EXAM:
CT ABDOMEN AND PELVIS WITH CONTRAST
TECHNIQUE: Multidetector CT imaging of the abdomen and pelvis was performed
using the standard protocol following bolus administration of
intravenous contrast.
CONTRAST:  100mL [id number] IOPAMIDOL (KNKY0T-066) INJECTION 61%

[Series 2: axial st · axial · 0.73mm/px · z∈[-726,-340]mm · 13 of 85 slices shown, 15 images]
[im 4/85  soft-tissue]
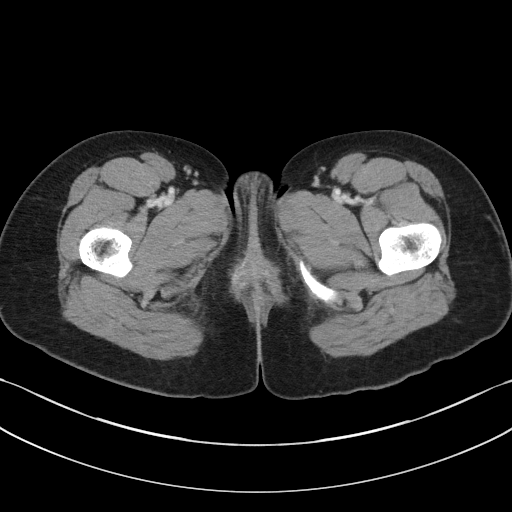
[im 4/85  bone]
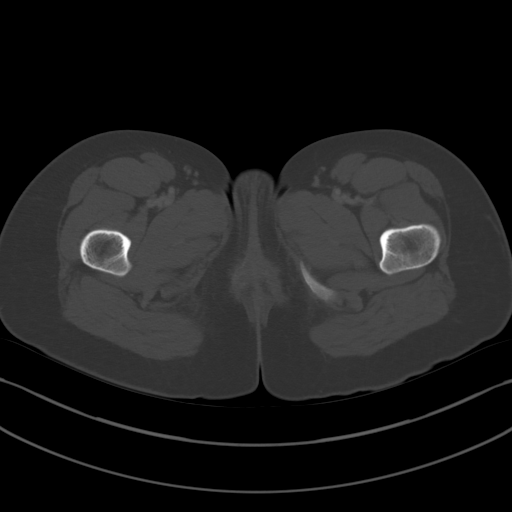
[im 12/85  soft-tissue]
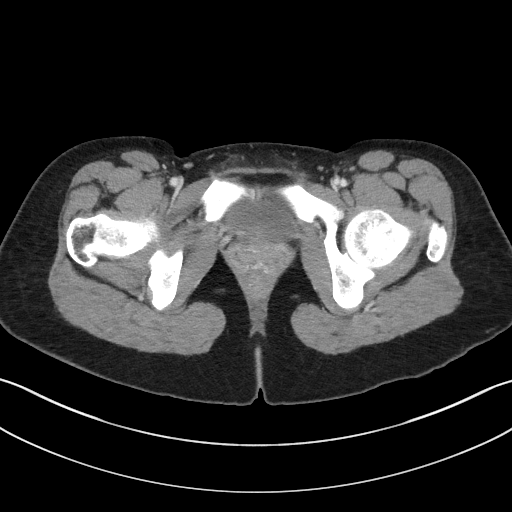
[im 20/85  soft-tissue]
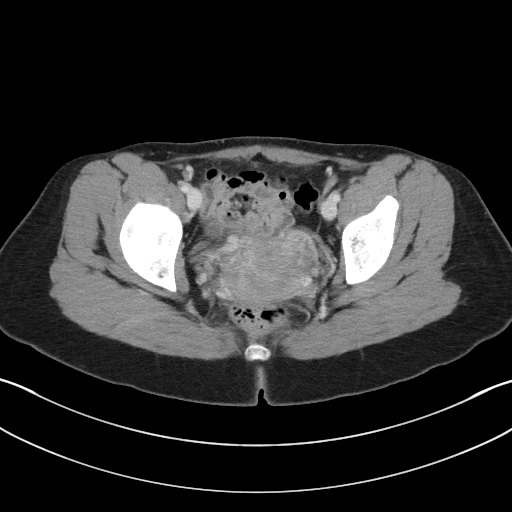
[im 23/85  soft-tissue]
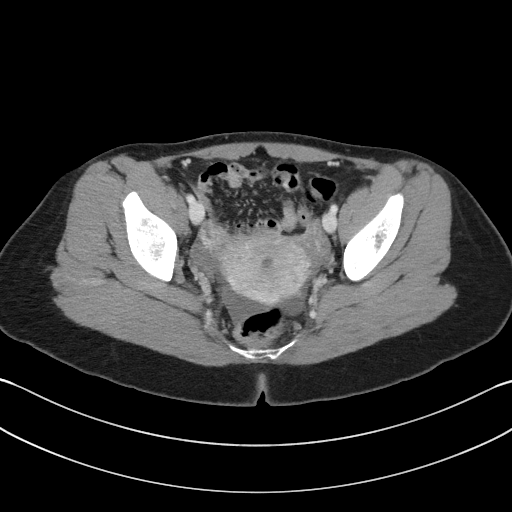
[im 31/85  soft-tissue]
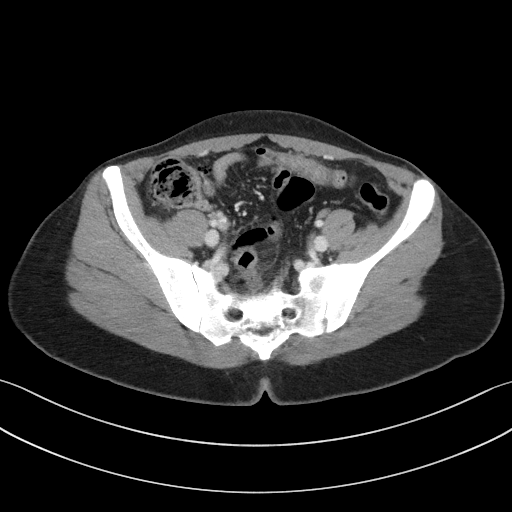
[im 35/85  soft-tissue]
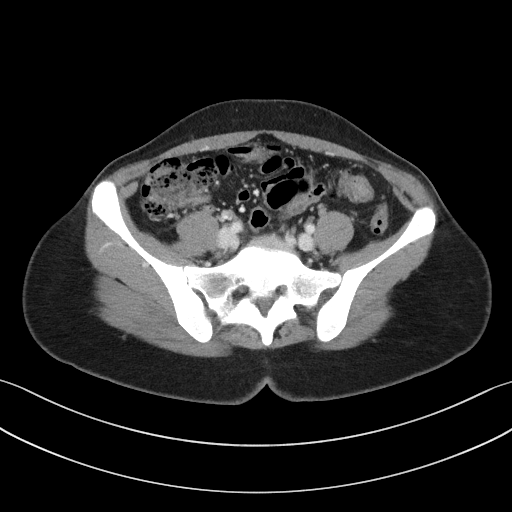
[im 43/85  soft-tissue]
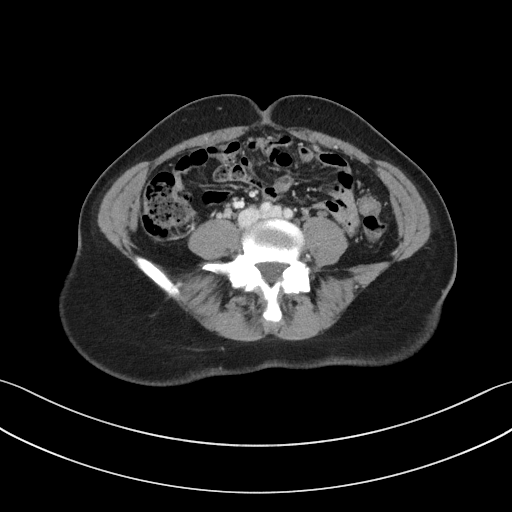
[im 50/85  soft-tissue]
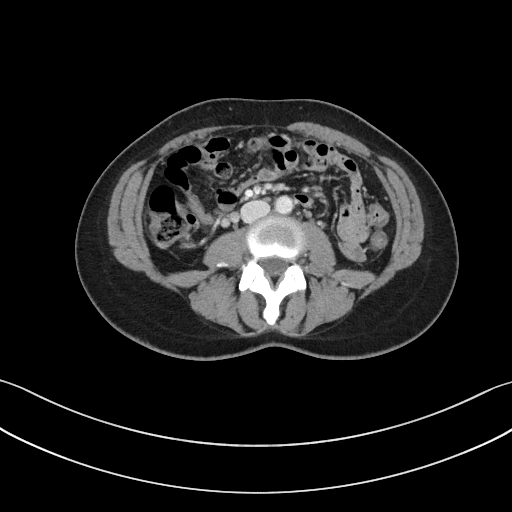
[im 54/85  soft-tissue]
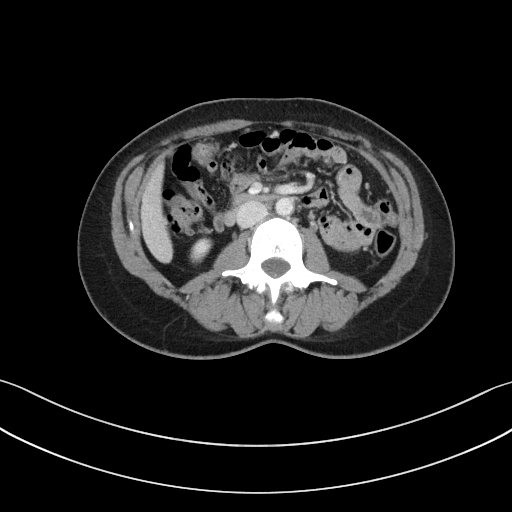
[im 54/85  bone]
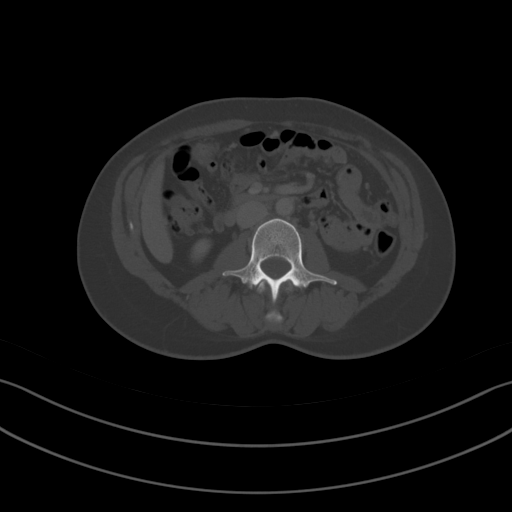
[im 62/85  soft-tissue]
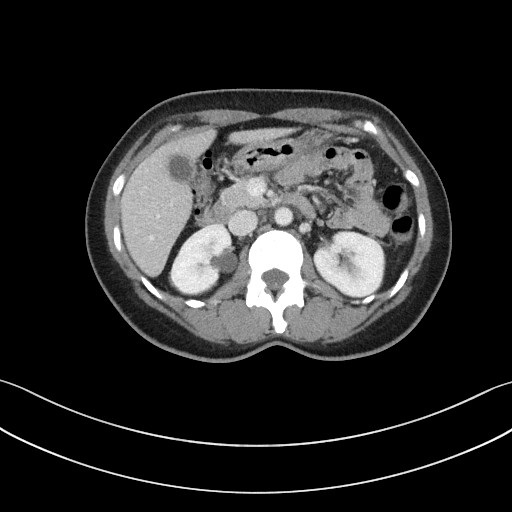
[im 65/85  soft-tissue]
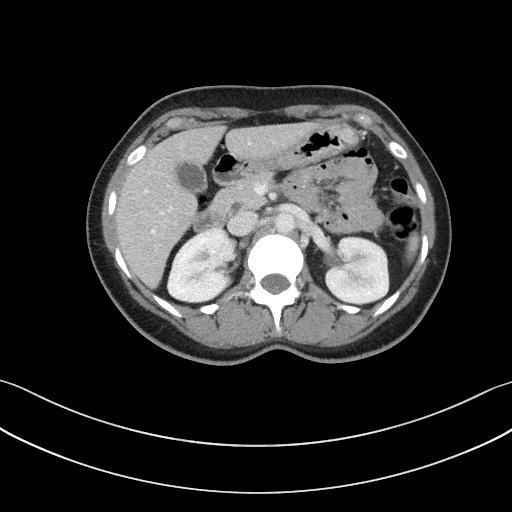
[im 73/85  soft-tissue]
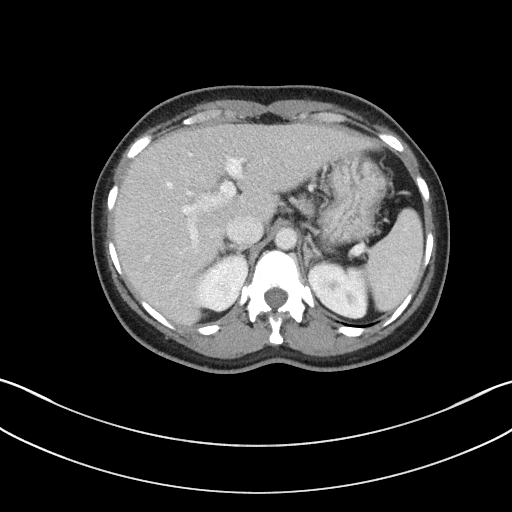
[im 81/85  soft-tissue]
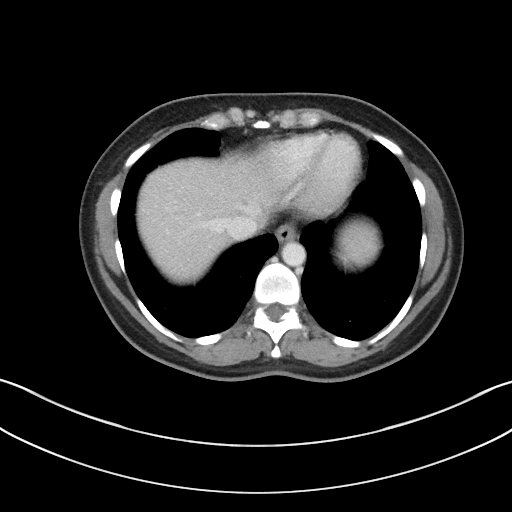

[Series 5: coronal st · coronal · 0.68mm/px · 3 of 82 slices shown]
[im 28/82  soft-tissue]
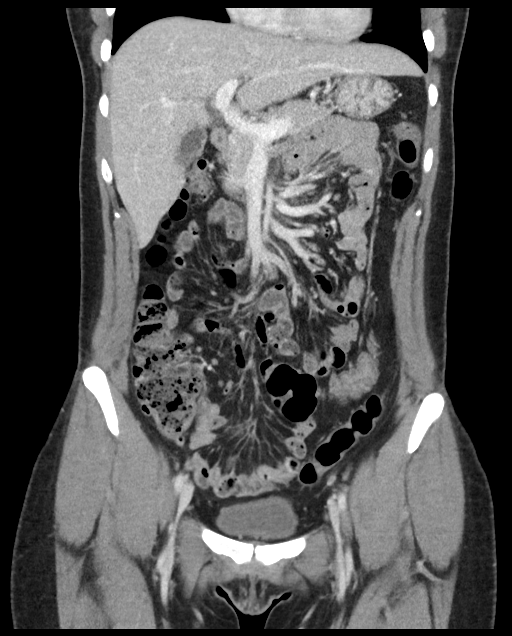
[im 37/82  soft-tissue]
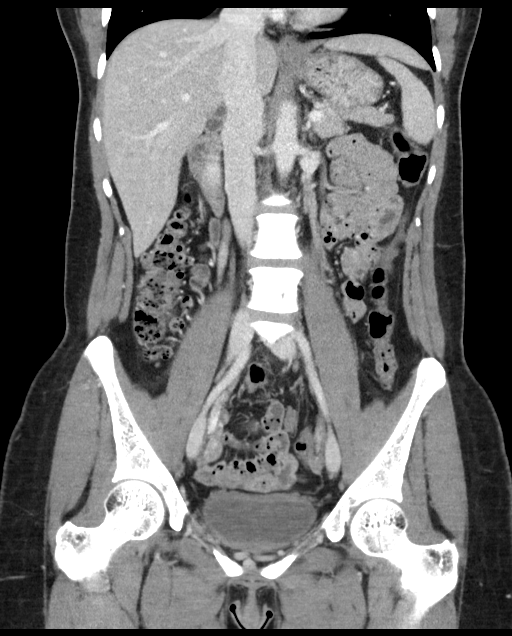
[im 46/82  soft-tissue]
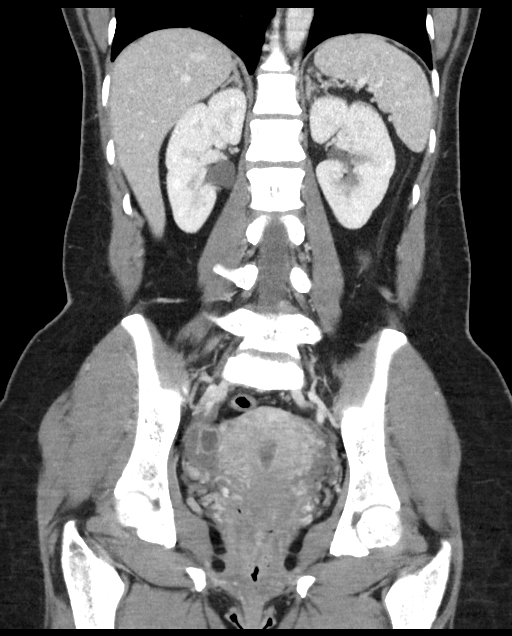

[16 of 46 positions shown; findings below may reference images not displayed]

FINDINGS: Lower chest: No acute abnormality.

Hepatobiliary: No focal liver abnormality is seen. No gallstones,
gallbladder wall thickening, or biliary dilatation.

Pancreas: Unremarkable. No pancreatic ductal dilatation or
surrounding inflammatory changes.

Spleen: Normal in size without focal abnormality.

Adrenals/Urinary Tract: Adrenal glands are unremarkable. Kidneys are
normal, without renal calculi, focal lesion, or hydronephrosis.
Bladder is unremarkable.

Stomach/Bowel: Stomach is within normal limits. Appendix appears
normal. No evidence of small bowel wall thickening, distention, or
inflammatory changes. Scattered colonic diverticula without evidence
of diverticulitis. Long segment of mild circumferential mucosal
thickening of the transverse colon.

Vascular/Lymphatic: No significant vascular findings are present. No
enlarged abdominal or pelvic lymph nodes.

Reproductive: Uterus and bilateral adnexa are unremarkable.

Other: Small amount of free fluid in the pelvis.

Musculoskeletal: No acute or significant osseous findings.
IMPRESSION: No evidence of diverticulitis.  Scattered colonic diverticulosis.

Long segment of mild circumferential mucosal thickening of the
transverse colon, likely due to colitis.

Small amount of free fluid in the pelvis, possibly physiologic.

## 2019-05-04 ENCOUNTER — Emergency Department (HOSPITAL_COMMUNITY)
Admission: EM | Admit: 2019-05-04 | Discharge: 2019-05-04 | Discharge status: Against Medical Advice | Payer: Medicaid Other | Attending: Emergency Medicine | Admitting: Emergency Medicine

## 2019-05-04 ENCOUNTER — Encounter (HOSPITAL_COMMUNITY): Payer: Self-pay | Admitting: Emergency Medicine

## 2019-05-04 ENCOUNTER — Other Ambulatory Visit: Payer: Self-pay

## 2019-05-04 DIAGNOSIS — R519 Headache, unspecified: Secondary | ICD-10-CM | POA: Insufficient documentation

## 2019-05-04 DIAGNOSIS — Z5321 Procedure and treatment not carried out due to patient leaving prior to being seen by health care provider: Secondary | ICD-10-CM | POA: Insufficient documentation

## 2019-05-04 NOTE — ED Notes (Signed)
Pt informed registration that she would like to leave.

## 2019-05-04 NOTE — ED Triage Notes (Signed)
Pt has chronic headaches/migraines. Today took Imatex for the first time and crushed it when she took it at 4pm. Her PCP told her that wasn't supposed to crush it and since still having headache and the high dose medication cant take anything else at home, need to go to ED.

## 2019-05-04 NOTE — ED Notes (Signed)
Pt called for recheck vitals, no response from the lobby 

## 2019-05-04 NOTE — ED Notes (Signed)
PT TO WINDOW. PT DESIRES TO LEAVE. THIS WRITER ENCOURAGES HER TO STAY OFFERING HER ADDITIONAL BLANKET, ICE OR WARM COMPRESS FOR COMFORT CARE. WE DISCUSS OTHER COMFORT MEASURES. PT IS INFORMED OF RISK AND BENEFITS AND BED STATUS. PT DECIDES TO SIT JUST OUTSIDE WAITING AREA ON BENCH. SHE STATES THE BRIGHT LIGHTS ARE STARTING TO HURT HER HEAD. I INFORMED TRIAGE PT WOULD BE WAITING (JUST OUTSIDE TRIAGE LOBBY)  FOR HER BED. I DID CONTRACT WITH PT TO CHECK IN WITH TRIAGE TO CONTINUE HER CARE PLAN. PT VERBALIZED UNDERSTANDING.

## 2019-05-09 ENCOUNTER — Other Ambulatory Visit: Payer: Self-pay

## 2019-05-09 ENCOUNTER — Encounter: Payer: Self-pay | Admitting: Neurology

## 2019-05-09 ENCOUNTER — Ambulatory Visit: Payer: Medicaid Other | Admitting: Neurology

## 2019-05-09 VITALS — BP 130/87 | HR 90 | Temp 97.7°F | Ht 66.0 in | Wt 137.0 lb

## 2019-05-09 DIAGNOSIS — R41840 Attention and concentration deficit: Secondary | ICD-10-CM

## 2019-05-09 DIAGNOSIS — G4489 Other headache syndrome: Secondary | ICD-10-CM | POA: Diagnosis not present

## 2019-05-09 DIAGNOSIS — Z8669 Personal history of other diseases of the nervous system and sense organs: Secondary | ICD-10-CM

## 2019-05-09 DIAGNOSIS — R6889 Other general symptoms and signs: Secondary | ICD-10-CM | POA: Diagnosis not present

## 2019-05-09 MED ORDER — PROPRANOLOL HCL 10 MG PO TABS: ORAL_TABLET | ORAL | 3 refills | Status: DC

## 2019-05-09 NOTE — Progress Notes (Signed)
Subjective:    Patient ID: Christine Mccormick is a 38 y.o. female.  HPI     Star Age, MD, PhD [facility] [address], [address] [place], [place] [address]  Dear [name],   I saw your patient, left ear, upon your kind request, for initial consultation of her recurrent headaches, concern for concussion.  The patient is unaccompanied today.  As you know, Ms. [name] is a 38 year old right-handed woman with an underlying medical history of Anemia, diverticulosis, hypertension, remote diagnosis of concussion, and migraine headaches, who reports a longstanding history of migraine headaches.  She has been on amitriptyline for several years, at least 2 as she recalls.  She has not been taking it on a daily basis she admits.  She does not recall trying any other headache preventative, she used to see a neurologist in [facility], she may not have seen him in the past 2 years.  She reports a remote diagnosis of concussion several years ago, probably in 2014 and 2017.  In the past 2 months she has had problems with forgetfulness and concentration and attention.  She has trouble focusing.  Of note, she does not sleep very well.  She admits to getting maybe 3 hours of sleep on any given day.  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  She is single.  She lives with her boyfriend.  She is a non-smoker and drinks alcohol occasionally to rarely, less than once a week.  She has been having problems with her eyes for years.  She was told that she has problems with her retina.  She has eyeglasses and contact lenses.  She has not seen an ophthalmologist in over 2 years she admits.  She has previously been seen by multiple specialists for her eyes.  She drinks caffeine in the form of soda, less than 1/day, she drinks 1 bottle of water per day on average, 16 ounce size.  She reports recent side effects on Imitrex  including feeling hot, feeling tightness in her throat and worsening of her headache.  She does not know the dose of her amitriptyline.  She has a prescription from her previous primary care physician.  She has had a head CT in the past, she has also had a brain MRI about 2 years ago with her previous neurologist.   I reviewed your office note from 05/01/2019.  She is currently on amitriptyline and promethazine and Flexeril.  She was started on Imitrex as needed.  She was given dexamethasone and Toradol injection in your office.  She also presented to the emergency room recently on 05/04/2019 for migraine headache, she had taken Imitrex at home.  She did not stay to complete her care in the emergency room.  Of note, she presented to the emergency room on 07/24/2018 for headaches but she did not stay to complete her care in the ED, she did not see a emergency room physician or PA. Of note, she had a head CT without contrast and cervical spine CT without contrast on 06/25/2015 and I was able to review the results: Impression, negative head CT, negative cervical spine CT. I was not able to see a prior brain MRI results.  We will try to get records from her neurologist in [place], [place], she is agreeable.  Her Past Medical History Is Significant For: Past Medical History:  Diagnosis Date  . Anemia   .  Blood transfusion without reported diagnosis   . Colitis   . Concussion    x2  . Diverticulosis   . Glaucoma   . Heart murmur   . Hypertension     Her Past Surgical History Is Significant For: Past Surgical History:  Procedure Laterality Date  . abalation    . COLONOSCOPY    . CYST EXCISION     left wrist  . DILATATION & CURETTAGE/HYSTEROSCOPY WITH MYOSURE    . HYSTEROSCOPY    . ROTATOR CUFF REPAIR     left shoulder  . TUBAL LIGATION      Her Family History Is Significant For: Family History  Problem Relation Age of Onset  . Kidney disease Father   . Colon cancer Neg Hx   .  Stomach cancer Neg Hx   . Esophageal cancer Neg Hx   . Colon polyps Neg Hx   . Rectal cancer Neg Hx     Her Social History Is Significant For: Social History   Socioeconomic History  . Marital status: Single    Spouse name: Not on file  . Number of children: 2  . Years of education: Not on file  . Highest education level: Not on file  Occupational History  . Occupation: unemployed  Social Needs  . Financial resource strain: Not on file  . Food insecurity    Worry: Not on file    Inability: Not on file  . Transportation needs    Medical: Not on file    Non-medical: Not on file  Tobacco Use  . Smoking status: Never Smoker  . Smokeless tobacco: Never Used  Substance and Sexual Activity  . Alcohol use: Yes    Comment: holidays  . Drug use: Never  . Sexual activity: Not on file  Lifestyle  . Physical activity    Days per week: Not on file    Minutes per session: Not on file  . Stress: Not on file  Relationships  . Social Musician on phone: Not on file    Gets together: Not on file    Attends religious service: Not on file    Active member of club or organization: Not on file    Attends meetings of clubs or organizations: Not on file    Relationship status: Not on file  Other Topics Concern  . Not on file  Social History Narrative  . Not on file    Her Allergies Are:  Allergies  Allergen Reactions  . Latex Rash  :   Her Current Medications Are:  Outpatient Encounter Medications as of 05/09/2019  Medication Sig  . ondansetron (ZOFRAN ODT) 8 MG disintegrating tablet Take 1 tablet (8 mg total) by mouth every 6 (six) hours as needed for nausea or vomiting.  . Simethicone (GAS RELIEF) 180 MG CAPS Take 1 capsule by mouth as needed.   No facility-administered encounter medications on file as of 05/09/2019.   :   Review of Systems:  Out of a complete 14 point review of systems, all are reviewed and negative with the exception of these symptoms as  listed below:  Review of Systems  Neurological:       Pt presents today to discuss her symptoms after her concussions. Pt has migraines and forgetfulness. Pt reports that she gets a migraine every other month and it can last up to two weeks. She has associated light and sound sensitivity and N/V. She has tried amitriptyline and imitrex.  Objective:  Neurological Exam  Physical Exam Physical Examination:   Vitals:   05/09/19 1610  BP: 130/87  Pulse: 90  Temp: 97.7 F (36.5 C)    General Examination: The patient is a very pleasant 38 y.o. female in no acute distress. She appears well-developed and well-nourished and well groomed.   HEENT: Normocephalic, atraumatic, pupils are equal, round and reactive to light and accommodation. Funduscopic exam is normal with sharp disc margins noted. Extraocular tracking is good without limitation to gaze excursion or nystagmus noted. Normal smooth pursuit is noted. Hearing is grossly intact. Face is symmetric with normal facial animation and normal facial sensation. Speech is clear with no dysarthria noted. There is no hypophonia. There is no lip, neck/head, jaw or voice tremor. Neck is supple with full range of passive and active motion. There are no carotid bruits on auscultation. Oropharynx exam reveals: mild mouth dryness, adequate dental hygiene and mild airway crowding, due to Wider uvula, tongue protrudes centrally in palate elevates symmetrically.  Tonsils are small.  Chest: Clear to auscultation without wheezing, rhonchi or crackles noted.  Heart: S1+S2+0, regular and normal without murmurs, rubs or gallops noted.   Abdomen: Soft, non-tender and non-distended with normal bowel sounds appreciated on auscultation.  Extremities: There is no pitting edema in the distal lower extremities bilaterally. Pedal pulses are intact.  Skin: Warm and dry without trophic changes noted.  Musculoskeletal: exam reveals no obvious joint deformities,  tenderness or joint swelling or erythema.   Neurologically:  Mental status: The patient is awake, alert and oriented in all 4 spheres. Her immediate and remote memory, attention, language skills and fund of knowledge are appropriate. There is no evidence of aphasia, agnosia, apraxia or anomia. Speech is clear with normal prosody and enunciation. Thought process is linear. Mood is normal and affect is normal.  Cranial nerves II - XII are as described above under HEENT exam. In addition: shoulder shrug is normal with equal shoulder height noted. Motor exam: Normal bulk, strength and tone is noted. There is no drift, tremor or rebound. Romberg is negative. Reflexes are 2+ throughout. Babinski: Toes are flexor bilaterally. Fine motor skills and coordination: intact with normal finger taps, normal hand movements, normal rapid alternating patting, normal foot taps and normal foot agility.  Cerebellar testing: No dysmetria or intention tremor on finger to nose testing. Heel to shin is unremarkable bilaterally. There is no truncal or gait ataxia.  Sensory exam: intact to light touch, vibration, and temperature in the upper and lower extremities.  Gait, station and balance: She stands easily. No veering to one side is noted. No leaning to one side is noted. Posture is age-appropriate and stance is narrow based. Gait shows normal stride length and normal pace. No problems turning are noted. Tandem walk is unremarkable.   Assessment and Plan:  In summary, Greg Eckrich is a very pleasant 38 y.o.-year old female with an underlying medical history of Anemia, diverticulosis, hypertension, remote diagnosis of concussion, and migraine headaches, who Presents for evaluation of her migraine headaches.  For headache prevention she may have only tried amitriptyline, we will try to get additional records from her previous neurologist.  In the past 2 months she has noticed forgetfulness and concentration and attention  problems, I suggested we proceed with a more formal memory evaluation with the help of a neuro psychologist, I made a referral in that regard and she is agreeable.  We will also proceed with a brain MRI with and without contrast and  hopefully also be able to compare findings from her MRI from 2 years ago.  We will be requesting MRI results from her previous neurologist.  She had a head CT without contrast and cervical spine CT without contrast in December 2016.  She reports a remote diagnosis of concussion.  Her neurological exam is nonfocal.  Nevertheless, she is advised to seek consultation with ophthalmology as she reports problems with her eyes for years, she has not seen an ophthalmologist since she moved from [place] some 2 years ago.  We talked about migraine and headache triggers.  She does not hydrate very well and is encouraged to increase her water intake.  In addition, she does not sleep enough, she is advised to try to sleep about 7 to 8 hours.  She reports difficulty falling asleep.  She has tried melatonin which caused her grogginess.  She has been on amitriptyline for migraine prevention but has not been taking it on a daily basis.  I suggested we try her on low-dose propranolol starting at 10 mg once daily in the evening.  It can cause some sedation, it may be beneficial for her to sleep a little sooner.  She is advised about potential side effects and I provided written instructions and a new prescription.  We will proceed with a brain MRI and neuropsychology referral.  I suggested a follow-up routinely in 3 months with the nurse practitioner, we will keep her posted as to her MRI results in the interim.  I answered all her questions today and she was in agreement.  Thank you very much for allowing me to participate in the care of this nice patient. If I can be of any further assistance to you please do not hesitate to call me at [phone].  Sincerely,   Huston FoleySaima Indyah Saulnier, MD, PhD

## 2019-05-09 NOTE — Patient Instructions (Addendum)
We will do a brain scan, called MRI and call you with the test results. We will have to schedule you for this on a separate date. This test requires authorization from your insurance, and we will take care of the insurance process.  For your concentration and attention issues, I will make a referral to a neuropsychologist for formal evaluation of your memory and at engine and concentration.  We will make a referral to another practice for this, you should hear within the next 2 weeks about an appointment for consultation.  Your headaches and concentration issues may be linked to lack of sleep.  Sleep deprivation can cause concentration and memory issues as well as recurrent headaches.   For migraine prevention I suggest we start you on low-dose propranolol 10 mg strength, take 1 pill at bedtime for the first week, then twice daily for the second week, then 3 times a day thereafter. Common side effect reported are: lethargy, sedation, low blood pressure and low pulse rate.

## 2019-05-10 ENCOUNTER — Telehealth: Payer: Self-pay | Admitting: Neurology

## 2019-05-10 NOTE — Telephone Encounter (Signed)
medicaid order sent to GI. They will obtain the auth and reach out to the patient to schedule.  °

## 2019-05-17 ENCOUNTER — Other Ambulatory Visit: Payer: Self-pay

## 2019-05-17 ENCOUNTER — Ambulatory Visit
Admission: RE | Admit: 2019-05-17 | Discharge: 2019-05-17 | Disposition: A | Discharge status: Home / Self Care | Payer: Medicaid Other | Source: Ambulatory Visit | Attending: Neurology | Admitting: Neurology

## 2019-05-17 DIAGNOSIS — G4489 Other headache syndrome: Secondary | ICD-10-CM

## 2019-05-17 MED ORDER — GADOBENATE DIMEGLUMINE 529 MG/ML IV SOLN
12.0000 mL | Freq: Once | INTRAVENOUS | Status: AC | PRN
  Administered 2019-05-17: 12 mL via INTRAVENOUS

## 2019-05-21 ENCOUNTER — Telehealth: Payer: Self-pay

## 2019-05-21 NOTE — Progress Notes (Signed)
MRI brain was reported as normal; pls update pt. Christine Mccormick

## 2019-05-21 NOTE — Telephone Encounter (Signed)
-----   Message from Star Age, MD sent at 05/21/2019  7:44 AM EST ----- MRI brain was reported as normal; pls update pt. Michel Bickers

## 2019-05-21 NOTE — Telephone Encounter (Signed)
I called pt and advised her that the MRI results were normal. Pt verbalized understanding of results. Pt had no questions at this time but was encouraged to call back if questions arise.

## 2019-08-09 ENCOUNTER — Other Ambulatory Visit: Payer: Self-pay

## 2019-08-09 ENCOUNTER — Encounter: Payer: Self-pay | Admitting: Family Medicine

## 2019-08-09 ENCOUNTER — Ambulatory Visit: Payer: Medicaid Other | Admitting: Family Medicine

## 2019-08-09 VITALS — BP 108/77 | HR 92 | Temp 97.5°F | Ht 66.0 in | Wt 134.0 lb

## 2019-08-09 DIAGNOSIS — R6889 Other general symptoms and signs: Secondary | ICD-10-CM | POA: Diagnosis not present

## 2019-08-09 DIAGNOSIS — R41840 Attention and concentration deficit: Secondary | ICD-10-CM | POA: Diagnosis not present

## 2019-08-09 DIAGNOSIS — G4489 Other headache syndrome: Secondary | ICD-10-CM

## 2019-08-09 NOTE — Progress Notes (Addendum)
PATIENT: Christine Mccormick DOB: [birth year]  REASON FOR VISIT: follow up HISTORY FROM: patient  Chief Complaint  Patient presents with  . Follow-up    3 mon f/u. Alone. Rm 9. No new concerns at this time.      HISTORY OF PRESENT ILLNESS: Today 08/09/19 Christine Mccormick is a 39 y.o. female here today for follow up for headaches and memory complaints. She was advised to start propranolol and titrate up to three times daily. She reports that she started the medication but can not remember if it helped or if she titrated up to three times daily. She reports that PCP recently diagnosed her with hyperthyroid.  She has been referred to an endocrinologist.  New patient appointment is scheduled for August.  She continues to have GI concerns of nausea and difficulty swallowing.  She feels that medications get stuck in her throat.  She has discontinued all medications.  She is not interested in restarting any medications at this time.  She wishes to pursue work-up for her thyroid.  She does feel that anxiety contributes to decreased concentration short-term memory concerns.  She is now being seen by a psychologist frequently.  She does feel that this is helping some.  Referral to neurocognitive psychology was denied.  HISTORY: (copied from Dr Teofilo Pod note on 07/09/2019)  Dear Christine Mccormick,   I saw your patient, left ear, upon your kind request, for initial consultation of her recurrent headaches, concern for concussion.  The patient is unaccompanied today.  As you know, Christine. Martie Mccormick is a 39 year old right-handed woman with an underlying medical history of Anemia, diverticulosis, hypertension, remote diagnosis of concussion, and migraine headaches, who reports a longstanding history of migraine headaches.  She has been on amitriptyline for several years, at least 2 as she recalls.  She has not been taking it on a daily basis she admits.  She does not recall trying any other headache preventative, she used to see  a neurologist in [facility], she may not have seen him in the past 2 years.  She reports a remote diagnosis of concussion several years ago, probably in 2014 and 2017.  In the past 2 months she has had problems with forgetfulness and concentration and attention.  She has trouble focusing.  Of note, she does not sleep very well.  She admits to getting maybe 3 hours of sleep on any given day.  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  She is single.  She lives with her boyfriend.  She is a non-smoker and drinks alcohol occasionally to rarely, less than once a week.  She has been having problems with her eyes for years.  She was told that she has problems with her retina.  She has eyeglasses and contact lenses.  She has not seen an ophthalmologist in over 2 years she admits.  She has previously been seen by multiple specialists for her eyes.  She drinks caffeine in the form of soda, less than 1/day, she drinks 1 bottle of water per day on average, 16 ounce size.  She reports recent side effects on Imitrex including feeling hot, feeling tightness in her throat and worsening of her headache.  She does not know the dose of her amitriptyline.  She has a prescription from her previous primary care physician.  She has had a head CT in the past, she has also had a brain MRI about 2 years ago  with her previous neurologist.   I reviewed your office note from 05/01/2019.  She is currently on amitriptyline and promethazine and Flexeril.  She was started on Imitrex as needed.  She was given dexamethasone and Toradol injection in your office.  She also presented to the emergency room recently on 05/04/2019 for migraine headache, she had taken Imitrex at home.  She did not stay to complete her care in the emergency room.  Of note, she presented to the emergency room on 07/24/2018 for headaches but she did not stay to complete her care in the ED, she did  not see a emergency room physician or PA. Of note, she had a head CT without contrast and cervical spine CT without contrast on 06/25/2015 and I was able to review the results: Impression, negative head CT, negative cervical spine CT. I was not able to see a prior brain MRI results.  We will try to get records from her neurologist in [place], [place], she is agreeable.   REVIEW OF SYSTEMS: Out of a complete 14 system review of symptoms, the patient complains only of the following symptoms, nausea, difficulty swallowing, palpitations and all other reviewed systems are negative.  ALLERGIES: Allergies  Allergen Reactions  . Latex Rash    HOME MEDICATIONS: Outpatient Medications Prior to Visit  Medication Sig Dispense Refill  . AMITRIPTYLINE HCL PO Take by mouth.    . ondansetron (ZOFRAN ODT) 8 MG disintegrating tablet Take 1 tablet (8 mg total) by mouth every 6 (six) hours as needed for nausea or vomiting. 30 tablet 1  . propranolol (INDERAL) 10 MG tablet 1 pill each bedtime for 1 week, then 1 pill twice daily for 2 weeks, then 1 pill 3 times a day thereafter. 90 tablet 3  . Simethicone (GAS RELIEF) 180 MG CAPS Take 1 capsule by mouth as needed.     No facility-administered medications prior to visit.    PAST MEDICAL HISTORY: Past Medical History:  Diagnosis Date  . Anemia   . Blood transfusion without reported diagnosis   . Colitis   . Concussion    x2  . Diverticulosis   . Glaucoma   . Heart murmur   . Hypertension   . Hypothyroidism    patient reported    PAST SURGICAL HISTORY: Past Surgical History:  Procedure Laterality Date  . abalation    . COLONOSCOPY    . CYST EXCISION     left wrist  . DILATATION & CURETTAGE/HYSTEROSCOPY WITH MYOSURE    . HYSTEROSCOPY    . ROTATOR CUFF REPAIR     left shoulder  . TUBAL LIGATION      FAMILY HISTORY: Family History  Problem Relation Age of Onset  . Kidney disease Father   . Colon cancer Neg Hx   . Stomach cancer  Neg Hx   . Esophageal cancer Neg Hx   . Colon polyps Neg Hx   . Rectal cancer Neg Hx     SOCIAL HISTORY: Social History   Socioeconomic History  . Marital status: Single    Spouse name: Not on file  . Number of children: 2  . Years of education: Not on file  . Highest education level: Not on file  Occupational History  . Occupation: unemployed  Tobacco Use  . Smoking status: Never Smoker  . Smokeless tobacco: Never Used  Substance and Sexual Activity  . Alcohol use: Yes    Comment: holidays  . Drug use: Never  . Sexual activity: Not on  file  Other Topics Concern  . Not on file  Social History Narrative  . Not on file   Social Determinants of Health   Financial Resource Strain:   . Difficulty of Paying Living Expenses: Not on file  Food Insecurity:   . Worried About Programme researcher, broadcasting/film/video in the Last Year: Not on file  . Ran Out of Food in the Last Year: Not on file  Transportation Needs:   . Lack of Transportation (Medical): Not on file  . Lack of Transportation (Non-Medical): Not on file  Physical Activity:   . Days of Exercise per Week: Not on file  . Minutes of Exercise per Session: Not on file  Stress:   . Feeling of Stress : Not on file  Social Connections:   . Frequency of Communication with Friends and Family: Not on file  . Frequency of Social Gatherings with Friends and Family: Not on file  . Attends Religious Services: Not on file  . Active Member of Clubs or Organizations: Not on file  . Attends Banker Meetings: Not on file  . Marital Status: Not on file  Intimate Partner Violence:   . Fear of Current or Ex-Partner: Not on file  . Emotionally Abused: Not on file  . Physically Abused: Not on file  . Sexually Abused: Not on file      PHYSICAL EXAM  Vitals:   08/09/19 1458  BP: 108/77  Pulse: 92  Temp: (!) 97.5 F (36.4 C)  TempSrc: Oral  Weight: 134 lb (60.8 kg)  Height: 5\' 6"  (1.676 m)   Body mass index is 21.63  kg/m.  Generalized: Well developed, in no acute distress  Cardiology: normal rate and rhythm, no murmur noted Respiratory: clear to auscultation bilaterally  Neurological examination  Mentation: Alert oriented to time, place, history taking. Follows all commands speech and language fluent Cranial nerve II-XII: Pupils were equal Mccormick reactive to light. Extraocular movements were full, visual field were full on  Motor: The motor testing reveals 5 over 5 strength of all 4 extremities. Good symmetric motor tone is noted throughout.  Gait and station: Gait is normal.  DIAGNOSTIC DATA (LABS, IMAGING, TESTING) - I reviewed patient records, labs, notes, testing and imaging myself where available.  No flowsheet data found.   Lab Results  Component Value Date   WBC 5.8 04/12/2018   HGB 13.1 04/12/2018   HCT 38.6 04/12/2018   MCV 91.4 04/12/2018   PLT 280.0 04/12/2018      Component Value Date/Time   NA 138 04/12/2018 1454   K 4.2 04/12/2018 1454   CL 105 04/12/2018 1454   CO2 28 04/12/2018 1454   GLUCOSE 79 04/12/2018 1454   BUN 7 04/12/2018 1454   CREATININE 0.68 04/12/2018 1454   CALCIUM 9.3 04/12/2018 1454   PROT 7.7 04/12/2018 1454   ALBUMIN 4.3 04/12/2018 1454   AST 11 04/12/2018 1454   ALT 8 04/12/2018 1454   ALKPHOS 55 04/12/2018 1454   BILITOT 0.4 04/12/2018 1454   GFRNONAA >60 11/30/2017 1831   GFRAA >60 11/30/2017 1831   No results found for: CHOL, HDL, LDLCALC, LDLDIRECT, TRIG, CHOLHDL No results found for: 12/02/2017 No results found for: VITAMINB12 Lab Results  Component Value Date   TSH 0.42 04/12/2018       ASSESSMENT AND PLAN 39 y.o. year old female  has a past medical history of Anemia, Blood transfusion without reported diagnosis, Colitis, Concussion, Diverticulosis, Glaucoma, Heart murmur, Hypertension, and Hypothyroidism.  here with     ICD-10-CM   1. Other headache syndrome  G44.89   2. Forgetfulness  R68.89   3. Attention or concentration deficit   R41.840     Christine Mccormick reports that she is doing relatively well with headaches.  She does continue to have 2-3 headaches per month.  She did take propranolol and feels that it may have helped but is not interested in taking medications at this time.  She is focused on having a formal work-up for concerns of hyperthyroidism by PCP.  She feels that swallowing pills is more difficult for her at this time.  She wishes to manage headaches with complementary therapy at this time.  She was encouraged to continue reaching out to endocrinology in hopes of a sooner appointment.  She will continue close follow-up with primary care in the meantime.  She will continue seeing psychology as this has been beneficial.  She may follow-up with [place] anytime for worsening headaches.    No orders of the defined types were placed in this encounter.    No orders of the defined types were placed in this encounter.     I spent 15 minutes with the patient. 50% of this time was spent counseling and educating patient on plan of care and medications.    Debbora Presto, FNP-C 08/09/2019, 4:18 PM [facility] [address] [place], [place] [address] [phone]   I reviewed the above note and documentation by the Nurse Practitioner and agree with the history, exam, assessment and plan as outlined above. I was available for consultation. Star Age, MD, PhD [facility])

## 2020-02-12 ENCOUNTER — Ambulatory Visit: Payer: Medicaid Other | Admitting: Internal Medicine

## 2020-04-28 ENCOUNTER — Other Ambulatory Visit: Payer: Self-pay

## 2020-04-28 ENCOUNTER — Encounter: Payer: Self-pay | Admitting: Physical Therapy

## 2020-04-28 ENCOUNTER — Ambulatory Visit: Payer: Medicaid Other | Attending: Endocrinology | Admitting: Physical Therapy

## 2020-04-28 DIAGNOSIS — G8929 Other chronic pain: Secondary | ICD-10-CM

## 2020-04-28 DIAGNOSIS — M5416 Radiculopathy, lumbar region: Secondary | ICD-10-CM | POA: Diagnosis present

## 2020-04-28 DIAGNOSIS — M5441 Lumbago with sciatica, right side: Secondary | ICD-10-CM | POA: Insufficient documentation

## 2020-04-28 DIAGNOSIS — M62830 Muscle spasm of back: Secondary | ICD-10-CM | POA: Diagnosis present

## 2020-04-29 ENCOUNTER — Encounter: Payer: Self-pay | Admitting: Physical Therapy

## 2020-04-29 NOTE — Therapy (Signed)
[facility] [address] [place], [place], [address] Phone: [phone]   Fax:  [phone]  Physical Therapy Evaluation  Patient Details  Name: Christine Mccormick MRN: [id number] Date of Birth: [birth year] Referring Provider (PT): Heath Gold    Encounter Date: 04/28/2020   PT End of Session - 04/28/20 1348    Visit Number 1    Number of Visits 12    Date for PT Re-Evaluation 06/09/20    Authorization Type [facility] out of network    PT Start Time 1330    PT Stop Time 1412    PT Time Calculation (min) 42 min    Activity Tolerance Patient tolerated treatment well    Behavior During Therapy [facility] for tasks assessed/performed           Past Medical History:  Diagnosis Date  . Anemia   . Blood transfusion without reported diagnosis   . Colitis   . Concussion    x2  . Diverticulosis   . Glaucoma   . Heart murmur   . Hypertension   . Hypothyroidism    patient reported    Past Surgical History:  Procedure Laterality Date  . abalation    . COLONOSCOPY    . CYST EXCISION     left wrist  . DILATATION & CURETTAGE/HYSTEROSCOPY WITH MYOSURE    . HYSTEROSCOPY    . ROTATOR CUFF REPAIR     left shoulder  . TUBAL LIGATION      There were no vitals filed for this visit.    Subjective Assessment - 04/28/20 1334    Subjective Patient began having lower back pain that radiates into her right leg in September. She intially had pain in both legs but the pain is mostly on the right side now. She had similar pain in the past follwoing a procedure. She had to have metal removed the last time but the metal is no longer presnet and she is having similar pain. She hd therapy in the past for her back. She did not feel like it helped before.    Pertinent History concusion x2 seven years ago and 3-4 years ago; RTC repair 7 yearsa ago;    How long can you sit comfortably? The longer she sits the more the pain increases    How long can you  stand comfortably? standing is Ok but at times she can have tingling in the leg.    How long can you walk comfortably? can have tingling in the leg    Patient Stated Goals less pain    Currently in Pain? Yes    Pain Score 2    can reach a 10/10   Pain Location Back    Pain Orientation Right;Left;Lower    Pain Descriptors / Indicators Aching    Pain Type Chronic pain    Pain Onset More than a month ago    Pain Frequency Intermittent    Aggravating Factors  Sitting, lying down, everything,    Pain Relieving Factors stretching, heat,    Effect of Pain on Daily Activities ifficulty standing for too long              [facility] PT Assessment - 04/29/20 0001      Assessment   Medical Diagnosis Low Back Paoin with Right radiculopathy     Referring Provider (PT) Heath Gold     Onset Date/Surgical Date --   September    Hand Dominance Right    Next MD  Visit Next month     Prior Therapy Has had therapy int he past. She reports it did not help.  She had therapy a few years ago      Precautions   Precautions None      Restrictions   Weight Bearing Restrictions No      Balance Screen   Has the patient fallen in the past 6 months No    Has the patient had a decrease in activity level because of a fear of falling?  No    Is the patient reluctant to leave their home because of a fear of falling?  No      Home Environment   Additional Comments 3 flights to get in her apartment. They cause her increased low back pain.       Prior Function   Level of Independence Independent    Vocation Full time employment    Vocation Requirements sits at a desk;     Leisure would like to be able to travel       Cognition   Overall Cognitive Status Within Functional Limits for tasks assessed    Attention Focused    Focused Attention Appears intact    Memory Appears intact    Awareness Appears intact    Problem Solving Appears intact      Observation/Other Assessments   Focus on Therapeutic  Outcomes (FOTO)  [facility]       Sensation   Light Touch Appears Intact    Additional Comments Numbness can radiate down to the knees. Her fingers and toes can become numb as well       Coordination   Gross Motor Movements are Fluid and Coordinated Yes    Fine Motor Movements are Fluid and Coordinated Yes      AROM   Lumbar Flexion full  but pain at end range     Lumbar Extension pain at end range     Lumbar - Right Side Bend pain to the right     Lumbar - Left Side Bend no pain     Lumbar - Right Rotation pressure     Lumbar - Left Rotation pulling on the right       PROM   Overall PROM Comments normal end feel but signifcant pain from 85 degrees of flexion and beyind; pain witrh passive IR of the right hip      Strength   Right Hip Flexion 3/5    Right Hip ABduction 4+/5    Right Hip ADduction 4+/5    Left Hip Flexion 4/5    Left Hip ABduction 4+/5    Left Hip ADduction 4+/5    Right Knee Flexion 4/5    Right Knee Extension 4/5    Left Knee Flexion 5/5    Left Knee Extension 5/5      Palpation   Palpation comment tenderness to palpation in the lower lumbar spine and hip. deep pressure not applied 2nd to patient anxiety with light activity       Bed Mobility   Bed Mobility --   Pain with base bed mobility      Transfers   Comments slow transfer from sit to stadn       Ambulation/Gait   Gait Comments mild lateral movement with gaiut. Mild decrease in single leg stance on the right.                       Objective measurements completed  on examination: See above findings.       [facility] Adult PT Treatment/Exercise - 04/29/20 0001      Self-Care   Self-Care Other Self-Care Comments    Other Self-Care Comments  Patient had reported in the past benefit from TENS. Therapy adivsed her that she can purchase one off line. She reports they are not the same as the one she obtained years ago and declined information.       Exercises   Exercises Lumbar       Lumbar Exercises: Stretches   Lower Trunk Rotation Limitations x10 attmepted to have poatient do in A PAIN FREE RANGE BUTR SHE REPORTED ALL RANGES CAUSED SIGNIFICANT PAIN IN HER BACK AND ANTERIOR HIP She was advised to try at home on her own bed.,     Piriformis Stretch Limitations Patient has pain in her lateral hip. She was given a gluteal stretch. She reported the stretch increased her pain. Therapy tried to adjust her tehcnique but she still had difficulty.     Other Lumbar Stretch Exercise Patient given tennis ball for self light soft tissue mobilization. She was advised she can put as much or as little pressure as she can tolerate. She reported piercing shooting pain in her back and pain in her left shoulder with pressure in her right gluteal. She was advised these are not noraml resposes to light soft tissue mobilization. She was advised to tryit again  when she is home but also stop if she continues to have piercing pain and pain in random areas of here body.                   PT Education - 04/28/20 1347    Education Details reviewed POC going forward    Person(s) Educated Patient    Methods Explanation;Demonstration;Tactile cues;Verbal cues    Comprehension Verbalized understanding;Returned demonstration;Tactile cues required;Verbal cues required            PT Short Term Goals - 04/28/20 1619      PT SHORT TERM GOAL #1   Title Patient will tolerate light movement activity without a significant increase in pain.    Baseline signifcant pain with basic movement    Time 4    Period Weeks    Status New    Target Date 05/26/20      PT SHORT TERM GOAL #2   Title Patient will increase passive right hip flexion to 110 without pain    Baseline 85 with pain    Time 4    Period Weeks    Status New    Target Date 05/26/20      PT SHORT TERM GOAL #3   Title Patient will increase right hip flexion strength to 4/5    Baseline 3+/5    Time 4    Period Weeks    Status New     Target Date 05/26/20      PT SHORT TERM GOAL #4   Title Patient will be independent with basic HEP for stretching and strengthening    Time 4    Period Weeks    Status New    Target Date 05/26/20                     Plan - 04/28/20 1445    Clinical Impression Statement Patient is a 39 year old female with significant bilateral lower back pain that radiates into her right lower extremity. She has weakness in her right  hip flexor and knee extensors. She has limited passive flexion and internal rotation of her hip. She has pain with right rotiation and pressure with right side bending. She has muscle spasming into her right gluteal and right lumbar paraspiinal. Therapy attempted to give her an HEP with light stretching and soft ttissue mobilization to decrease pain. Unfourtuneltly she had an increase in pain and radiuclar symptoms despite the exercises being just basic pain reliveing stretches. She had low tolerance to sitting and supine positioning. Given her high levels of pain with light activty and her past histroy of low back pain being caused by an internal source we will try a limited trial of therapy. She was given some exercises to try at home. Next visit we will try to give her things more geared towards tolerable positions if one can be found, as well as manual therapy. If she sees no improvement we will send her back to the MD for further follow up. She also reports pain and tightness in her cervical spine and upper traps. If we have some improvement in the lower back it may be worth getting a prescription for the cerivical spine and upper back.    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 limited ability to perfrom light stretching   PT Frequency 1x / week    PT Duration 4 weeks    PT Treatment/Interventions ADLs/Self Care Home Management;Electrical Stimulation;Cryotherapy;Iontophoresis 4mg /ml Dexamethasone    PT Home Exercise Plan attmepte LAD with low grad eoccilations; continue with hip strethcing if able; review HEP; consder standing strengthening; consider prayer or sink stretching; If patient continues to have significant pain send back to MD for further evaluation    Consulted and Agree with Plan of Care Patient           Patient will benefit from skilled therapeutic intervention in order to improve the following deficits and impairments:  Abnormal gait, Decreased range of motion, Decreased activity tolerance, Pain, Decreased strength, Hypomobility, Increased muscle spasms, Decreased endurance, Difficulty walking  Visit Diagnosis: Chronic bilateral low back pain with right-sided sciatica  Radiculopathy, lumbar region  Muscle spasm of back     Problem List There are no problems to display for this patient.   PT DPT  04/29/2020, 10:43 AM  [facility] [address] [place], [place], [place] Phone: [phone]   Fax:  [phone]  Name: Iesha Summerhill MRN: [name] Date of Birth: 1981-05-24

## 2020-04-29 NOTE — Patient Instructions (Signed)
Access Code: [email or web address] URL: [email or web address] Date: 04/29/2020 Prepared by: Lorayne Bender  Exercises .Standing Glute Med Mobilization with Small Ball on Wall - 1 x daily - 7 x weekly - 3 sets - 3 min hold .Supine Lower Trunk Rotation - 1 x daily - 7 x weekly - 3 sets - 10 reps .Supine Piriformis Stretch with Foot on Ground - 1 x daily - 7 x weekly - 3 sets - 3 reps - 20 sec hold hold

## 2020-05-13 ENCOUNTER — Encounter: Payer: Self-pay | Admitting: Physical Therapy

## 2020-05-13 ENCOUNTER — Ambulatory Visit: Payer: Medicaid Other | Attending: Endocrinology | Admitting: Physical Therapy

## 2020-05-13 ENCOUNTER — Other Ambulatory Visit: Payer: Self-pay

## 2020-05-13 DIAGNOSIS — M5416 Radiculopathy, lumbar region: Secondary | ICD-10-CM | POA: Diagnosis present

## 2020-05-13 DIAGNOSIS — G8929 Other chronic pain: Secondary | ICD-10-CM

## 2020-05-13 DIAGNOSIS — M5441 Lumbago with sciatica, right side: Secondary | ICD-10-CM | POA: Insufficient documentation

## 2020-05-13 DIAGNOSIS — M62830 Muscle spasm of back: Secondary | ICD-10-CM

## 2020-05-14 NOTE — Therapy (Addendum)
Christine Mccormick, [place], [address] Phone: [phone]   Fax:  [phone]  Physical Therapy Treatment/Discharge   Patient Details  Name: Christine Mccormick MRN: [id number] Date of Birth: 06/26/1981 Referring Provider (PT): Christoper Fabian    Encounter Date: 05/13/2020   PT End of Session - 05/13/20 1341    Visit Number 2    Number of Visits 12    Date for PT Re-Evaluation 06/09/20    Authorization Type MCD out of network    PT Start Time 1330    PT Stop Time 1355   lack of tolerance to treatment   PT Time Calculation (min) 25 min    Activity Tolerance Patient tolerated treatment well    Behavior During Therapy [facility] for tasks assessed/performed           Past Medical History:  Diagnosis Date  . Anemia   . Blood transfusion without reported diagnosis   . Colitis   . Concussion    x2  . Diverticulosis   . Glaucoma   . Heart murmur   . Hypertension   . Hypothyroidism    patient reported    Past Surgical History:  Procedure Laterality Date  . abalation    . COLONOSCOPY    . CYST EXCISION     left wrist  . DILATATION & CURETTAGE/HYSTEROSCOPY WITH MYOSURE    . HYSTEROSCOPY    . ROTATOR CUFF REPAIR     left shoulder  . TUBAL LIGATION      There were no vitals filed for this visit.   Subjective Assessment - 05/13/20 1337    Subjective Patient reports her pain level has is a 2/10 today. She reprts her back is the same. She reports the exercises have hurt too bad. She reports they feel like they are stabbing her in the back.    Pertinent History concusion x2 seven years ago and 3-4 years ago; RTC repair 7 yearsa ago;    How long can you stand comfortably? standing is Ok but at times she can have tingling in the leg.    How long can you walk comfortably? can have tingling in the leg    Patient Stated Goals less pain    Currently in Pain? Yes    Pain Score 2     Pain Location Back    Pain  Orientation Right    Pain Descriptors / Indicators Aching    Pain Type Chronic pain    Pain Onset More than a month ago    Pain Frequency Intermittent    Aggravating Factors  Sitting    Pain Relieving Factors heat    Effect of Pain on Daily Activities difficulty perfroming activity for too long                             [facility] Adult PT Treatment/Exercise - 05/14/20 0001      Lumbar Exercises: Aerobic   Nustep 5 min L 1. After patient reported feeling like she had worked out for 5 hours.       Lumbar Exercises: Seated   Other Seated Lumbar Exercises attemted seted abll stretch for traction 5x 5s ec hold forwrd reported increased radicualr pain; attmepted to go away from the right side and reported pulling pain. Attmepted ball press for abdomianl activiation and had to stop because of pain. At this time therapy halted session. All interventions seemed to be causing  increased pain despite limited movement.       Lumbar Exercises: Supine   Other Supine Lumbar Exercises attmepted lower trunk rotation. Patient able to do the first set but reported pulling on the second set. Therapy advised patient that she should feel pulling. It is a stretch.       Manual Therapy   Manual Therapy Soft tissue mobilization    Manual therapy comments attemoted LAD tolerated at first on the left but then reported increased pressure on the right in her lower abdomen; Trialed on the right but had a significant increase in pain ;     Soft tissue mobilization attemoted side lying trigger point release to each side. Therapy put minimal pressure in the patients back but                   PT Education - 05/13/20 1339    Education Details reviewed the benefits of strengthening    Person(s) Educated Patient    Methods Explanation;Demonstration;Tactile cues;Verbal cues    Comprehension Verbalized understanding;Returned demonstration;Verbal cues required;Tactile cues required             PT Short Term Goals - 04/28/20 1619      PT SHORT TERM GOAL #1   Title Patient will tolerate light movement activity without a significant increase in pain.    Baseline signifcant pain with basic movement    Time 4    Period Weeks    Status New    Target Date 05/26/20      PT SHORT TERM GOAL #2   Title Patient will increase passive right hip flexion to 110 without pain    Baseline 85 with pain    Time 4    Period Weeks    Status New    Target Date 05/26/20      PT SHORT TERM GOAL #3   Title Patient will increase right hip flexion strength to 4/5    Baseline 3+/5    Time 4    Period Weeks    Status New    Target Date 05/26/20      PT SHORT TERM GOAL #4   Title Patient will be independent with basic HEP for stretching and strengthening    Time 4    Period Weeks    Status New    Target Date 05/26/20                    Plan - 05/13/20 1351    Clinical Impression Statement Patient could not tolerate ttreamtnet today. She reported 5 minutes on the lowest level of the nu-step made her right leg feel like she worked out for 5 hours. Low level movements such as low trunk trotation and light ball roll out were intolerable. She could not tolerate light touch to her lumbar spine. She reported LAD on the right side caused pressure in her lower abdomen. on her right side. Given her intolerance to basic pain reliving activity, she is not appropraite for PT at this time. She has abnormal resposnes to pain reliving interventions. Given her past history of low back pain from a prophylactic proedure and her poor respose to musckulo -skeletal intervention we will send her back for further foillow up.    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;    Examination-Activity Limitations Bend;Bed Mobility;Sit;Carry;Lift;Locomotion Level    Examination-Participation  Restrictions Cleaning;Laundry;Shop;Driving;Meal Prep  Clinical Decision Making Moderate    Rehab Potential Fair    PT Frequency 1x / week    PT Duration 4 weeks    PT Treatment/Interventions ADLs/Self Care Home Management;Electrical Stimulation;Cryotherapy;Iontophoresis 38m/ml Dexamethasone    PT Home Exercise Plan D/C back to MD    Consulted and Agree with Plan of Care Patient           Patient will benefit from skilled therapeutic intervention in order to improve the following deficits and impairments:  Abnormal gait, Decreased range of motion, Decreased activity tolerance, Pain, Decreased strength, Hypomobility, Increased muscle spasms, Decreased endurance, Difficulty walking  Visit Diagnosis: Chronic bilateral low back pain with right-sided sciatica  Radiculopathy, lumbar region  Muscle spasm of back  PHYSICAL THERAPY DISCHARGE SUMMARY  Visits from Start of Care: 2  Current functional level related to goals / functional outcomes: Unable to tolerate therapy    Remaining deficits: Sent back to MD   Education / Equipment: HEP but unable to perform it  Plan: Patient agrees to discharge.  Patient goals were not met. Patient is being discharged due to meeting the stated rehab goals.  ?????       Problem List There are no problems to display for this patient.   DCarney Living PT DPT  05/14/2020, 8:59 AM  [facility] [place] [address]Phone: [phone]  Fax:  [phone] Name: LArdyce HeyerMRN: [id number] of Birth: 6May 20, 1982

## 2020-06-26 ENCOUNTER — Ambulatory Visit (HOSPITAL_COMMUNITY): Payer: Self-pay

## 2020-06-26 ENCOUNTER — Emergency Department (HOSPITAL_COMMUNITY): Payer: Medicaid Other

## 2020-06-26 ENCOUNTER — Emergency Department (HOSPITAL_COMMUNITY)
Admission: EM | Admit: 2020-06-26 | Discharge: 2020-06-27 | Disposition: A | Discharge status: Home / Self Care | Payer: Medicaid Other | Attending: Emergency Medicine | Admitting: Emergency Medicine

## 2020-06-26 ENCOUNTER — Other Ambulatory Visit: Payer: Self-pay

## 2020-06-26 DIAGNOSIS — R0602 Shortness of breath: Secondary | ICD-10-CM | POA: Insufficient documentation

## 2020-06-26 DIAGNOSIS — R61 Generalized hyperhidrosis: Secondary | ICD-10-CM | POA: Diagnosis not present

## 2020-06-26 DIAGNOSIS — Z5321 Procedure and treatment not carried out due to patient leaving prior to being seen by health care provider: Secondary | ICD-10-CM | POA: Insufficient documentation

## 2020-06-26 DIAGNOSIS — M25512 Pain in left shoulder: Secondary | ICD-10-CM | POA: Diagnosis not present

## 2020-06-26 DIAGNOSIS — R072 Precordial pain: Secondary | ICD-10-CM | POA: Insufficient documentation

## 2020-06-26 LAB — CBC
HCT: 38.4 % (ref 36.0–46.0)
Hemoglobin: 12.6 g/dL (ref 12.0–15.0)
MCH: 30.5 pg (ref 26.0–34.0)
MCHC: 32.8 g/dL (ref 30.0–36.0)
MCV: 93 fL (ref 80.0–100.0)
Platelets: 316 10*3/uL (ref 150–400)
RBC: 4.13 MIL/uL (ref 3.87–5.11)
RDW: 12.6 % (ref 11.5–15.5)
WBC: 6.1 10*3/uL (ref 4.0–10.5)
nRBC: 0 % (ref 0.0–0.2)

## 2020-06-26 LAB — BASIC METABOLIC PANEL
Anion gap: 12 (ref 5–15)
BUN: 6 mg/dL (ref 6–20)
CO2: 25 mmol/L (ref 22–32)
Calcium: 9.1 mg/dL (ref 8.9–10.3)
Chloride: 99 mmol/L (ref 98–111)
Creatinine, Ser: 0.64 mg/dL (ref 0.44–1.00)
GFR, Estimated: >60 mL/min (ref >60)
Glucose, Bld: 95 mg/dL (ref 70–99)
Potassium: 3.4 mmol/L — ABNORMAL LOW (ref 3.5–5.1)
Sodium: 136 mmol/L (ref 135–145)

## 2020-06-26 LAB — TROPONIN I (HIGH SENSITIVITY)
Troponin I (High Sensitivity): <2 ng/L (ref <18)
Troponin I (High Sensitivity): <2 ng/L (ref <18)

## 2020-06-26 NOTE — ED Triage Notes (Signed)
Pt endorses central chest pain with radiation to L shoulder x 1 hour, with associated shob and diaphoresis, onset while lying down. Pain worse with movement/exertion.

## 2020-06-27 NOTE — ED Notes (Signed)
Pt left because of wait time.  

## 2021-01-26 ENCOUNTER — Emergency Department (HOSPITAL_COMMUNITY): Payer: Medicaid Other

## 2021-01-26 ENCOUNTER — Emergency Department (HOSPITAL_COMMUNITY)
Admission: EM | Admit: 2021-01-26 | Discharge: 2021-01-26 | Disposition: A | Discharge status: Home / Self Care | Payer: Medicaid Other | Attending: Emergency Medicine | Admitting: Emergency Medicine

## 2021-01-26 ENCOUNTER — Encounter (HOSPITAL_COMMUNITY): Payer: Self-pay | Admitting: *Deleted

## 2021-01-26 DIAGNOSIS — Z91040 Latex allergy status: Secondary | ICD-10-CM | POA: Diagnosis not present

## 2021-01-26 DIAGNOSIS — E039 Hypothyroidism, unspecified: Secondary | ICD-10-CM | POA: Insufficient documentation

## 2021-01-26 DIAGNOSIS — N390 Urinary tract infection, site not specified: Secondary | ICD-10-CM | POA: Diagnosis not present

## 2021-01-26 DIAGNOSIS — I10 Essential (primary) hypertension: Secondary | ICD-10-CM | POA: Insufficient documentation

## 2021-01-26 DIAGNOSIS — R1030 Lower abdominal pain, unspecified: Secondary | ICD-10-CM

## 2021-01-26 DIAGNOSIS — B9689 Other specified bacterial agents as the cause of diseases classified elsewhere: Secondary | ICD-10-CM | POA: Insufficient documentation

## 2021-01-26 LAB — CBC
HCT: 38.7 % (ref 36.0–46.0)
Hemoglobin: 13.2 g/dL (ref 12.0–15.0)
MCH: 31.9 pg (ref 26.0–34.0)
MCHC: 34.1 g/dL (ref 30.0–36.0)
MCV: 93.5 fL (ref 80.0–100.0)
Platelets: 301 10*3/uL (ref 150–400)
RBC: 4.14 MIL/uL (ref 3.87–5.11)
RDW: 12.5 % (ref 11.5–15.5)
WBC: 9.8 10*3/uL (ref 4.0–10.5)
nRBC: 0 % (ref 0.0–0.2)

## 2021-01-26 LAB — COMPREHENSIVE METABOLIC PANEL
ALT: 14 U/L (ref 0–44)
AST: 15 U/L (ref 15–41)
Albumin: 4.5 g/dL (ref 3.5–5.0)
Alkaline Phosphatase: 51 U/L (ref 38–126)
Anion gap: 8 (ref 5–15)
BUN: 8 mg/dL (ref 6–20)
CO2: 26 mmol/L (ref 22–32)
Calcium: 9.5 mg/dL (ref 8.9–10.3)
Chloride: 105 mmol/L (ref 98–111)
Creatinine, Ser: 0.56 mg/dL (ref 0.44–1.00)
GFR, Estimated: >60 mL/min (ref >60)
Glucose, Bld: 93 mg/dL (ref 70–99)
Potassium: 4.4 mmol/L (ref 3.5–5.1)
Sodium: 139 mmol/L (ref 135–145)
Total Bilirubin: 0.8 mg/dL (ref 0.3–1.2)
Total Protein: 8.2 g/dL — ABNORMAL HIGH (ref 6.5–8.1)

## 2021-01-26 LAB — URINALYSIS, ROUTINE W REFLEX MICROSCOPIC
Bilirubin Urine: NEGATIVE
Glucose, UA: NEGATIVE mg/dL
Ketones, ur: 80 mg/dL — AB
Nitrite: POSITIVE — AB
Protein, ur: 30 mg/dL — AB
Specific Gravity, Urine: 1.018 (ref 1.005–1.030)
WBC, UA: >50 WBC/hpf — ABNORMAL HIGH (ref 0–5)
pH: 6 (ref 5.0–8.0)

## 2021-01-26 LAB — POC URINE PREG, ED: Preg Test, Ur: NEGATIVE

## 2021-01-26 LAB — LIPASE, BLOOD: Lipase: 24 U/L (ref 11–51)

## 2021-01-26 MED ORDER — KETOROLAC TROMETHAMINE 15 MG/ML IJ SOLN
15.0000 mg | Freq: Once | INTRAMUSCULAR | Status: AC
  Administered 2021-01-26: 15 mg via INTRAVENOUS
  Filled 2021-01-26: qty 1

## 2021-01-26 MED ORDER — CEPHALEXIN 250 MG/5ML PO SUSR: 500.0000 mg | Freq: Three times a day (TID) | ORAL | 0 refills | Status: AC

## 2021-01-26 MED ORDER — IOHEXOL 350 MG/ML SOLN
80.0000 mL | Freq: Once | INTRAVENOUS | Status: AC | PRN
  Administered 2021-01-26: 80 mL via INTRAVENOUS

## 2021-01-26 MED ORDER — FENTANYL CITRATE (PF) 100 MCG/2ML IJ SOLN
50.0000 ug | Freq: Once | INTRAMUSCULAR | Status: AC
  Administered 2021-01-26: 50 ug via INTRAVENOUS
  Filled 2021-01-26: qty 2

## 2021-01-26 MED ORDER — SODIUM CHLORIDE (PF) 0.9 % IJ SOLN
INTRAMUSCULAR | Status: AC
  Filled 2021-01-26: qty 50

## 2021-01-26 MED ORDER — SODIUM CHLORIDE 0.9 % IV BOLUS
500.0000 mL | Freq: Once | INTRAVENOUS | Status: AC
  Administered 2021-01-26: 500 mL via INTRAVENOUS

## 2021-01-26 MED ORDER — ONDANSETRON HCL 4 MG/2ML IJ SOLN
4.0000 mg | Freq: Once | INTRAMUSCULAR | Status: AC
  Administered 2021-01-26: 4 mg via INTRAVENOUS
  Filled 2021-01-26: qty 2

## 2021-01-26 MED ORDER — SODIUM CHLORIDE 0.9 % IV SOLN
1.0000 g | Freq: Once | INTRAVENOUS | Status: AC
  Administered 2021-01-26: 1 g via INTRAVENOUS
  Filled 2021-01-26: qty 10

## 2021-01-26 NOTE — ED Triage Notes (Signed)
Per EMS, pt from home c/o abd pain and emesis since 10pm last night. A&Ox4. Hx colitis. Hx hysterectomy.

## 2021-01-26 NOTE — ED Provider Notes (Signed)
[facility]-EMERGENCY DEPT Provider Note   CSN: [id number] Arrival date & time: 01/26/21  1708     History Chief Complaint  Patient presents with   Abdominal Pain    Christine Mccormick is a 40 y.o. female.  The history is provided by the patient.  Abdominal Pain Christine Mccormick is a 40 y.o. female who presents to the Emergency Department complaining of abdominal pain.  She presents to the ED complaining of one week of lower abdominal pain, greatest over the right lower quadrant.  Pain is described as similar to her sciatica pain.   Pain is constant in nature.   No fever. Vomiting started today.  No diarrhea, dysuria, vaginal discharge.  No new sexual partners.    No prior abdominal surgeries.      Past Medical History:  Diagnosis Date   Anemia    Blood transfusion without reported diagnosis    Colitis    Concussion    x2   Diverticulosis    Glaucoma    Heart murmur    Hypertension    Hypothyroidism    patient reported    There are no problems to display for this patient.   Past Surgical History:  Procedure Laterality Date   abalation     COLONOSCOPY     CYST EXCISION     left wrist   DILATATION & CURETTAGE/HYSTEROSCOPY WITH MYOSURE     HYSTEROSCOPY     ROTATOR CUFF REPAIR     left shoulder   TUBAL LIGATION       OB History   No obstetric history on file.     Family History  Problem Relation Age of Onset   Kidney disease Father    Colon cancer Neg Hx    Stomach cancer Neg Hx    Esophageal cancer Neg Hx    Colon polyps Neg Hx    Rectal cancer Neg Hx     Social History   Tobacco Use   Smoking status: Never   Smokeless tobacco: Never  Vaping Use   Vaping Use: Never used  Substance Use Topics   Alcohol use: Yes    Comment: holidays   Drug use: Never    Home Medications Prior to Admission medications   Medication Sig Start Date End Date Taking? Authorizing Provider  cephALEXin (KEFLEX) 250 MG/5ML suspension Take 10  mLs (500 mg total) by mouth 3 (three) times daily for 7 days. 01/26/21 02/02/21 Yes Tilden Fossa, MD    Allergies    Latex  Review of Systems   Review of Systems  Gastrointestinal:  Positive for abdominal pain.  All other systems reviewed and are negative.  Physical Exam Updated Vital Signs BP 124/85   Pulse 90   Temp 99 F (37.2 C) (Oral)   Resp 15   SpO2 95%   Physical Exam Vitals and nursing note reviewed.  Constitutional:      Appearance: She is well-developed.  HENT:     Head: Normocephalic and atraumatic.  Cardiovascular:     Rate and Rhythm: Normal rate and regular rhythm.     Heart sounds: No murmur heard. Pulmonary:     Effort: Pulmonary effort is normal. No respiratory distress.     Breath sounds: Normal breath sounds.  Abdominal:     Palpations: Abdomen is soft.     Tenderness: There is no guarding or rebound.     Comments: Moderate lower abdominal tenderness  Musculoskeletal:        General:  No tenderness.  Skin:    General: Skin is warm and dry.  Neurological:     Mental Status: She is alert and oriented to person, place, and time.  Psychiatric:        Behavior: Behavior normal.    ED Results / Procedures / Treatments   Labs (all labs ordered are listed, but only abnormal results are displayed) Labs Reviewed  COMPREHENSIVE METABOLIC PANEL - Abnormal; Notable for the following components:      Result Value   Total Protein 8.2 (*)    All other components within normal limits  URINALYSIS, ROUTINE W REFLEX MICROSCOPIC - Abnormal; Notable for the following components:   APPearance CLOUDY (*)    Hgb urine dipstick SMALL (*)    Ketones, ur 80 (*)    Protein, ur 30 (*)    Nitrite POSITIVE (*)    Leukocytes,Ua MODERATE (*)    WBC, UA >50 (*)    Bacteria, UA FEW (*)    All other components within normal limits  URINE CULTURE  LIPASE, BLOOD  CBC  POC URINE PREG, ED    EKG None  Radiology CT Abdomen Pelvis W Contrast  Result Date:  01/26/2021 CLINICAL DATA:  Right lower quadrant pain EXAM: CT ABDOMEN AND PELVIS WITH CONTRAST TECHNIQUE: Multidetector CT imaging of the abdomen and pelvis was performed using the standard protocol following bolus administration of intravenous contrast. CONTRAST:  42mL OMNIPAQUE IOHEXOL 350 MG/ML SOLN COMPARISON:  11/30/2017 FINDINGS: Lower chest: Lung bases demonstrate no acute consolidation or effusion. Normal cardiac size Hepatobiliary: No focal liver abnormality is seen. No gallstones, gallbladder wall thickening, or biliary dilatation. Pancreas: Unremarkable. No pancreatic ductal dilatation or surrounding inflammatory changes. Spleen: Normal in size without focal abnormality. Adrenals/Urinary Tract: Adrenal glands are unremarkable. Kidneys are normal, without renal calculi, focal lesion, or hydronephrosis. Bladder is unremarkable. Stomach/Bowel: Stomach is within normal limits. Appendix appears normal. No evidence of bowel wall thickening, distention, or inflammatory changes. Vascular/Lymphatic: No significant vascular findings are present. No enlarged abdominal or pelvic lymph nodes. Reproductive: Probable small uterine fibroids.  No adnexal mass Other: No abdominal wall hernia or abnormality. No abdominopelvic ascites. Musculoskeletal: No acute or significant osseous findings. IMPRESSION: 1. No CT evidence for acute intra-abdominal or pelvic abnormality. 2. Small uterine fibroids Electronically Signed   By: Jasmine Pang M.D.   On: 01/26/2021 22:04    Procedures Procedures   Medications Ordered in ED Medications  sodium chloride (PF) 0.9 % injection (has no administration in time range)  sodium chloride 0.9 % bolus 500 mL (0 mLs Intravenous Stopped 01/26/21 2309)  cefTRIAXone (ROCEPHIN) 1 g in sodium chloride 0.9 % 100 mL IVPB (0 g Intravenous Stopped 01/26/21 2227)  fentaNYL (SUBLIMAZE) injection 50 mcg (50 mcg Intravenous Given 01/26/21 2134)  ondansetron (ZOFRAN) injection 4 mg (4 mg Intravenous  Given 01/26/21 2128)  iohexol (OMNIPAQUE) 350 MG/ML injection 80 mL (80 mLs Intravenous Contrast Given 01/26/21 2147)  ketorolac (TORADOL) 15 MG/ML injection 15 mg (15 mg Intravenous Given 01/26/21 2307)    ED Course  I have reviewed the triage vital signs and the nursing notes.  Pertinent labs & imaging results that were available during my care of the patient were reviewed by me and considered in my medical decision making (see chart for details).    MDM Rules/Calculators/A&P                         patient here for evaluation of lower abdominal  pain for the last week. She has tenderness on examination without peritoneal findings. Given her abdominal tenderness a CT scan was obtained. CT is negative for acute appendicitis. UA is concerning for UTI. Will treat with antibiotics. Discussed with patient home care for UTI as well as outpatient follow-up and return precautions.  Final Clinical Impression(s) / ED Diagnoses Final diagnoses:  Lower abdominal pain  Acute UTI    Rx / DC Orders ED Discharge Orders          Ordered    cephALEXin (KEFLEX) 250 MG/5ML suspension  3 times daily        01/26/21 2243             Tilden Fossa, MD 01/26/21 2336

## 2021-01-26 NOTE — ED Provider Notes (Signed)
Emergency Medicine Provider Triage Evaluation Note  Christine Mccormick , a 40 y.o. female  was evaluated in triage.  Pt complains of who presents with concern for 1 week of intermittent nausea and vomiting now with 24 hours of severe bilateral lower abdominal pain.  History of colitis.  Unable to tolerate p.o. times multiple days, tearful in triage.  Patient with history of diverticulosis and colitis that required antibiotics.  States she is not on any medications every day.  Review of Systems  Positive: Abdominal pain, nausea, NBNB emesis Negative: Diarrhea, rectal bleeding, melena, hematochezia, vaginal bleeding or discharge  Physical Exam  BP 132/87   Pulse (!) 102   Temp 99.1 F (37.3 C) (Oral)   Resp 16   SpO2 96%  Gen:   Awake, no distress   Resp:  Normal effort  MSK:   Moves extremities without difficulty  Other:  Generalized abdominal tenderness palpation in the lower quadrants without rebound or guarding.  RRR no M/R/G.  Mucous membranes dry.  Medical Decision Making  Medically screening exam initiated at 6:34 PM.  Appropriate orders placed.  Christine Mccormick was informed that the remainder of the evaluation will be completed by another provider, this initial triage assessment does not replace that evaluation, and the importance of remaining in the ED until their evaluation is complete.  This chart was dictated using voice recognition software, Dragon. Despite the best efforts of this provider to proofread and correct errors, errors may still occur which can change documentation meaning.  Antiemetic offered, patient declined.   Christine Mccormick 01/26/21 Christine Mccormick    Tilden Fossa, MD 01/26/21 2052

## 2021-01-29 LAB — URINE CULTURE: Culture: >=100000 — AB

## 2021-01-30 ENCOUNTER — Telehealth: Payer: Self-pay

## 2021-01-30 NOTE — Telephone Encounter (Signed)
Post ED Visit - Positive Culture Follow-up  Culture report reviewed by antimicrobial stewardship pharmacist: [facility] []  , Pharm.D. []  Enzo Bi, Pharm.D., BCPS AQ-ID []  , Pharm.D., BCPS []  Celedonio Miyamoto, .D., BCPS []  [place], .D., BCPS, AAHIVP []  Georgina Pillion, Pharm.D., BCPS, AAHIVP []  [address], PharmD, BCPS []  , PharmD, BCPS []  [place], PharmD, BCPS []  [address], PharmD []  , PharmD, BCPS []  Estella Husk, PharmD  Pharmacy Team [x]  Lysle Pearl, PharmD []  , PharmD []  Phillips Climes, PharmD []  , Rph []  Agapito Games) , PharmD []  Verlan Friends, PharmD []  , PharmD []  Mervyn Gay, PharmD []  , PharmD []  Vinnie Level, PharmD []  [facility] [employer], PharmD []  , PharmD []  Lucky Cowboy, PharmD   Positive urine culture Treated with Cephalexin, organism sensitive to the same and no further patient follow-up is required at this time.  01/30/2021, 11:45 AM

## 2021-11-22 IMAGING — CT CT ABD-PELV W/ CM
2 of 5 series · 16 of 46 positions shown, 18 images · IV contrast (omnipaque)
Comparison: 11/30/2017

CLINICAL DATA: Right lower quadrant pain

EXAM:
CT ABDOMEN AND PELVIS WITH CONTRAST
TECHNIQUE: Multidetector CT imaging of the abdomen and pelvis was performed
using the standard protocol following bolus administration of
intravenous contrast.
CONTRAST:  80mL OMNIPAQUE IOHEXOL 350 MG/ML SOLN

[Series 2: axial st · axial · 0.52mm/px · z∈[-466,-116]mm · 13 of 82 slices shown, 15 images]
[im 6/82  soft-tissue]
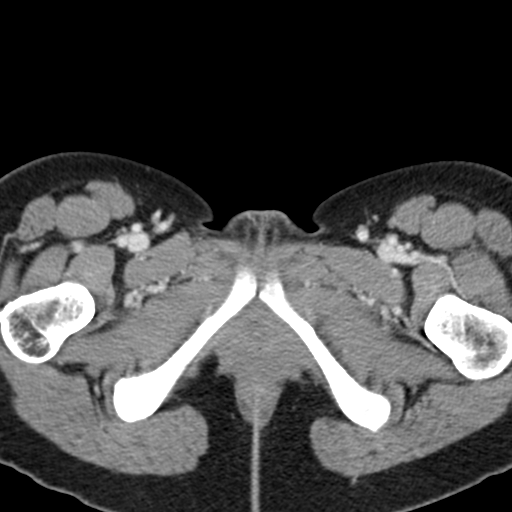
[im 6/82  bone]
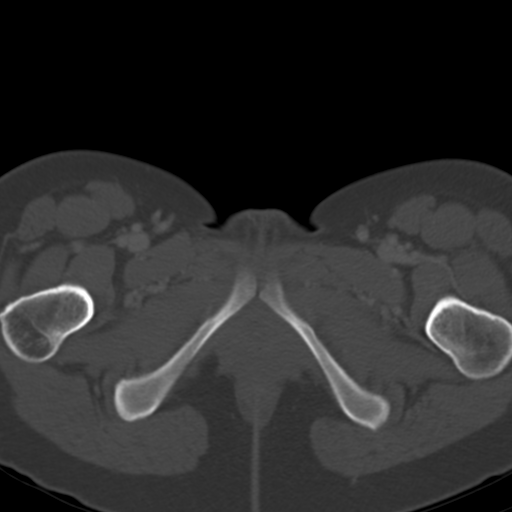
[im 11/82  soft-tissue]
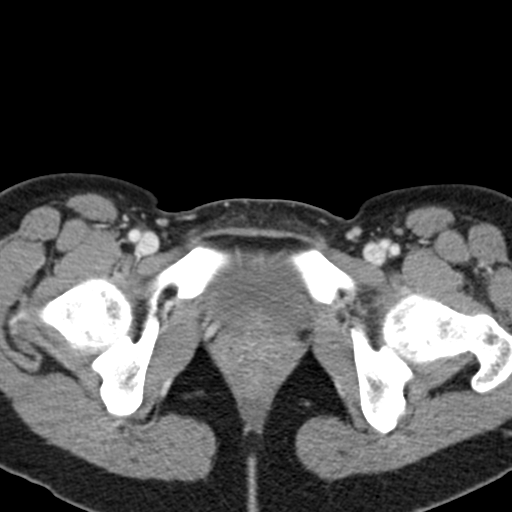
[im 17/82  soft-tissue]
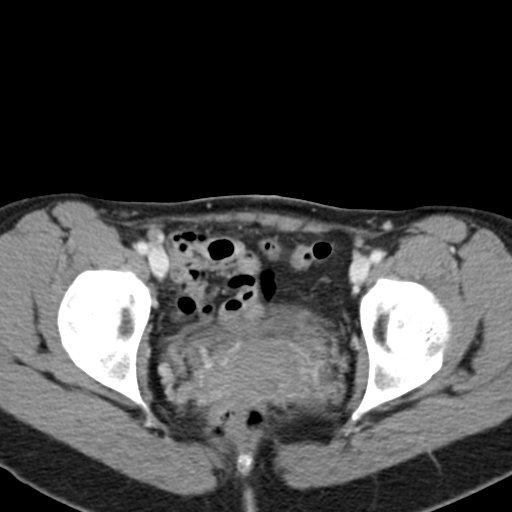
[im 22/82  soft-tissue]
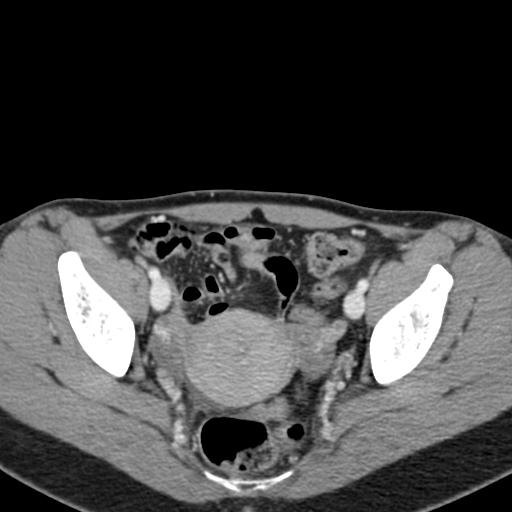
[im 28/82  soft-tissue]
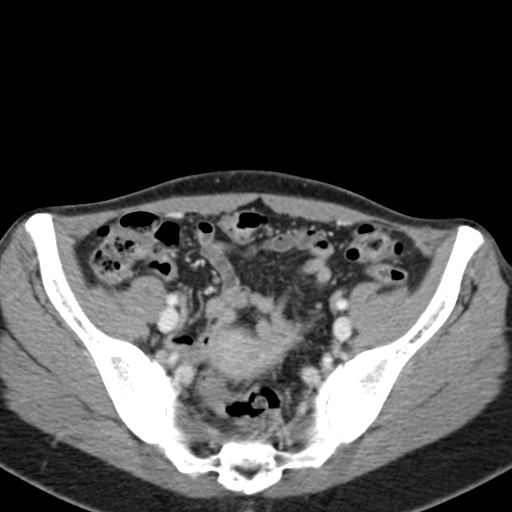
[im 33/82  soft-tissue]
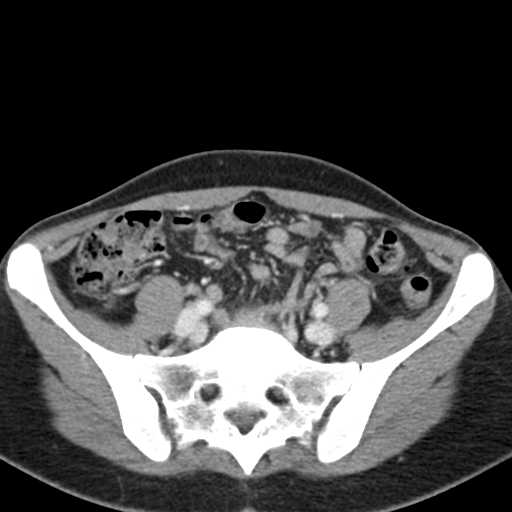
[im 44/82  soft-tissue]
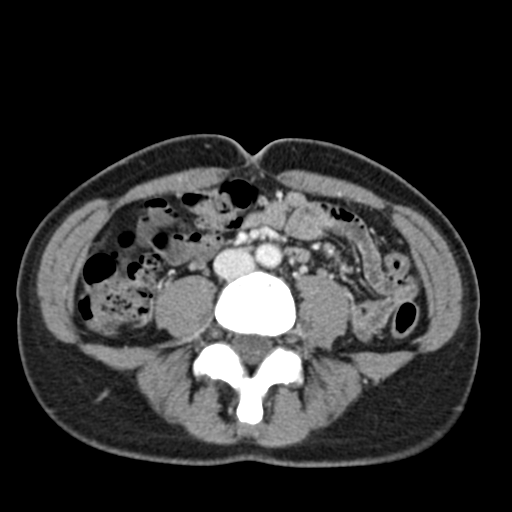
[im 49/82  soft-tissue]
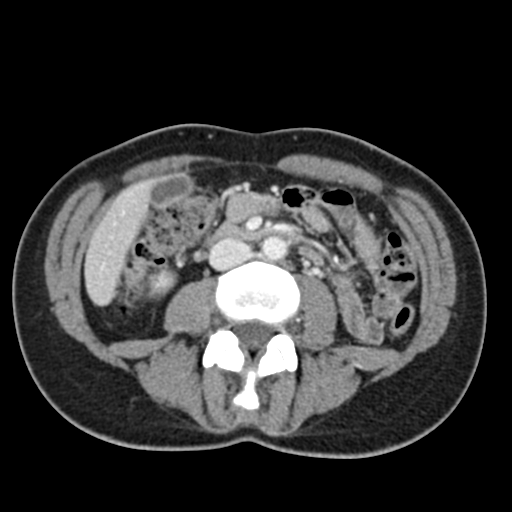
[im 55/82  soft-tissue]
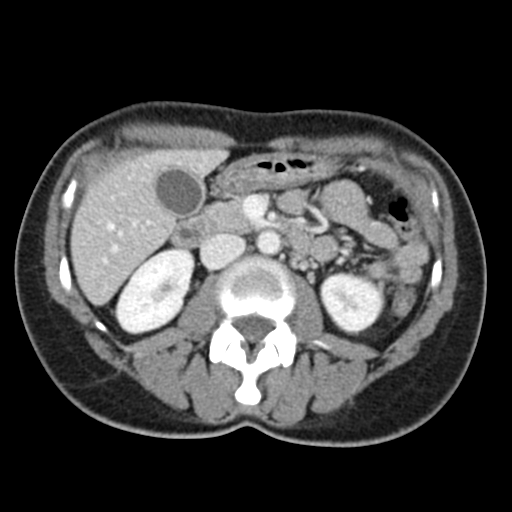
[im 55/82  bone]
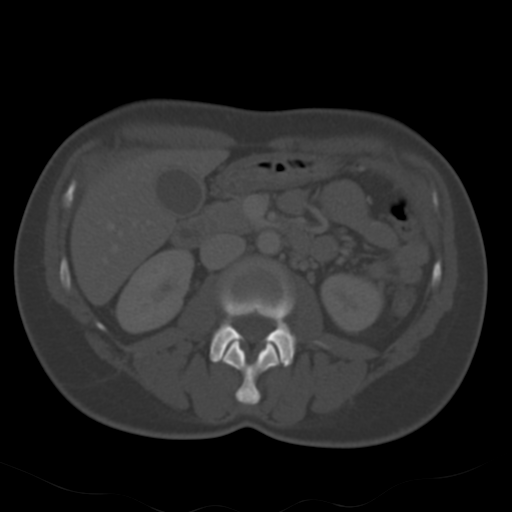
[im 60/82  soft-tissue]
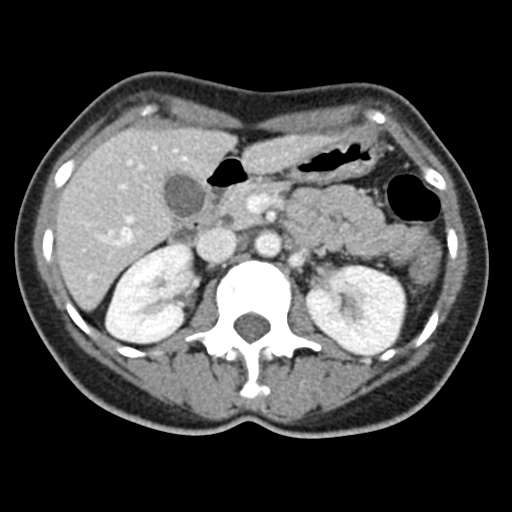
[im 65/82  soft-tissue]
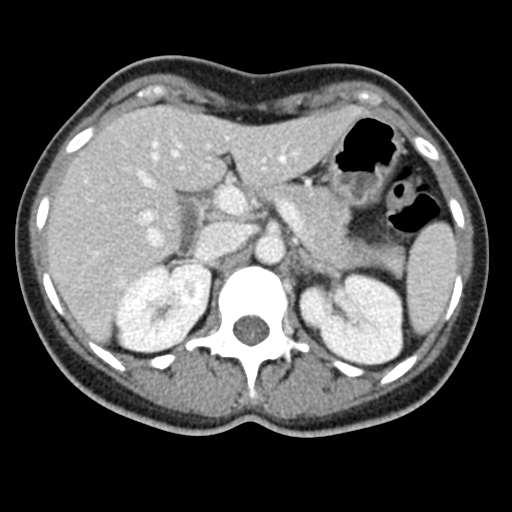
[im 71/82  soft-tissue]
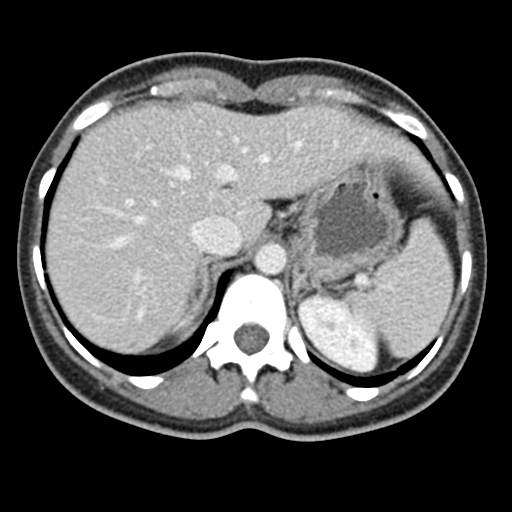
[im 76/82  soft-tissue]
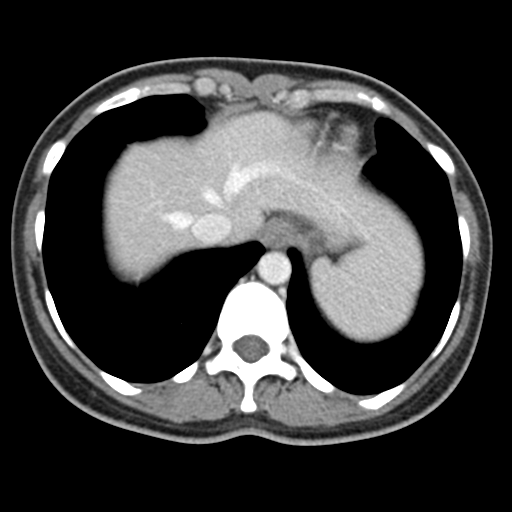

[Series 5: coronal st · coronal · 0.56mm/px · 3 of 120 slices shown]
[im 40/120  soft-tissue]
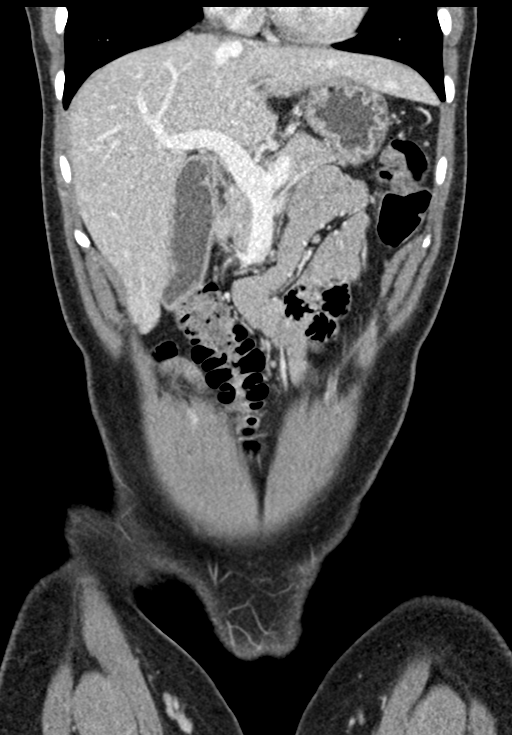
[im 53/120  soft-tissue]
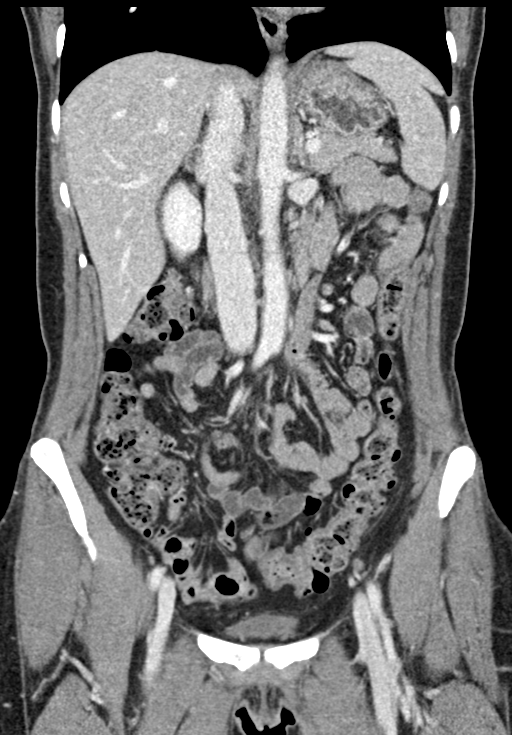
[im 67/120  soft-tissue]
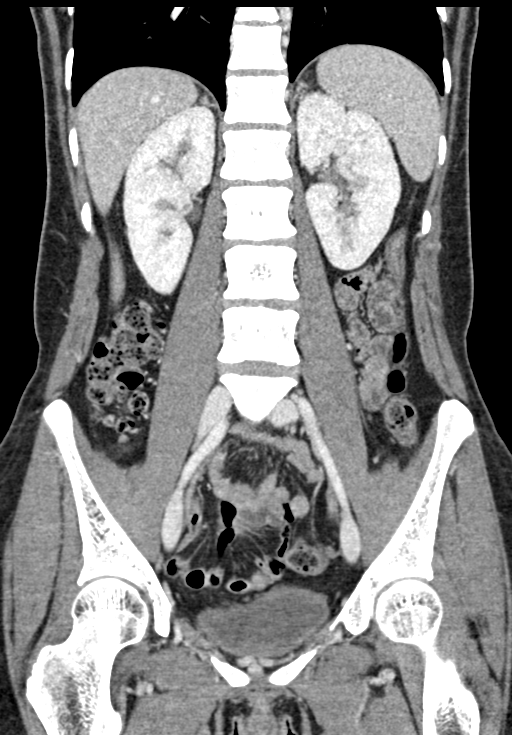

[16 of 46 positions shown; findings below may reference images not displayed]

FINDINGS: Lower chest: Lung bases demonstrate no acute consolidation or
effusion. Normal cardiac size

Hepatobiliary: No focal liver abnormality is seen. No gallstones,
gallbladder wall thickening, or biliary dilatation.

Pancreas: Unremarkable. No pancreatic ductal dilatation or
surrounding inflammatory changes.

Spleen: Normal in size without focal abnormality.

Adrenals/Urinary Tract: Adrenal glands are unremarkable. Kidneys are
normal, without renal calculi, focal lesion, or hydronephrosis.
Bladder is unremarkable.

Stomach/Bowel: Stomach is within normal limits. Appendix appears
normal. No evidence of bowel wall thickening, distention, or
inflammatory changes.

Vascular/Lymphatic: No significant vascular findings are present. No
enlarged abdominal or pelvic lymph nodes.

Reproductive: Probable small uterine fibroids.  No adnexal mass

Other: No abdominal wall hernia or abnormality. No abdominopelvic
ascites.

Musculoskeletal: No acute or significant osseous findings.
IMPRESSION: 1. No CT evidence for acute intra-abdominal or pelvic abnormality.
2. Small uterine fibroids

## 2022-06-20 ENCOUNTER — Other Ambulatory Visit: Payer: Self-pay

## 2022-06-20 ENCOUNTER — Emergency Department (HOSPITAL_COMMUNITY)
Admission: EM | Admit: 2022-06-20 | Discharge: 2022-06-20 | Discharge status: Against Medical Advice | Payer: Medicaid Other | Attending: Emergency Medicine | Admitting: Emergency Medicine

## 2022-06-20 DIAGNOSIS — M5441 Lumbago with sciatica, right side: Secondary | ICD-10-CM | POA: Diagnosis not present

## 2022-06-20 DIAGNOSIS — Z5321 Procedure and treatment not carried out due to patient leaving prior to being seen by health care provider: Secondary | ICD-10-CM | POA: Diagnosis not present

## 2022-06-20 DIAGNOSIS — M5430 Sciatica, unspecified side: Secondary | ICD-10-CM

## 2022-06-20 NOTE — ED Triage Notes (Addendum)
Pt reports "sciatica pain" on right lower back radiating down right leg. Pt took Hydrocodone/Acetaminophen pta but reports no relief

## 2022-09-24 ENCOUNTER — Encounter (HOSPITAL_BASED_OUTPATIENT_CLINIC_OR_DEPARTMENT_OTHER): Payer: Self-pay | Admitting: Emergency Medicine

## 2022-09-24 ENCOUNTER — Emergency Department (HOSPITAL_BASED_OUTPATIENT_CLINIC_OR_DEPARTMENT_OTHER): Payer: Medicaid Other

## 2022-09-24 ENCOUNTER — Emergency Department (HOSPITAL_BASED_OUTPATIENT_CLINIC_OR_DEPARTMENT_OTHER)
Admission: EM | Admit: 2022-09-24 | Discharge: 2022-09-24 | Disposition: A | Discharge status: Home / Self Care | Payer: Medicaid Other | Attending: Emergency Medicine | Admitting: Emergency Medicine

## 2022-09-24 ENCOUNTER — Other Ambulatory Visit: Payer: Self-pay

## 2022-09-24 DIAGNOSIS — W228XXA Striking against or struck by other objects, initial encounter: Secondary | ICD-10-CM | POA: Diagnosis not present

## 2022-09-24 DIAGNOSIS — S0990XA Unspecified injury of head, initial encounter: Secondary | ICD-10-CM | POA: Diagnosis present

## 2022-09-24 MED ORDER — KETOROLAC TROMETHAMINE 15 MG/ML IJ SOLN
15.0000 mg | Freq: Once | INTRAMUSCULAR | Status: AC
Start: 2022-09-24 — End: 2022-09-24
  Administered 2022-09-24: 15 mg via INTRAMUSCULAR
  Filled 2022-09-24: qty 1

## 2022-09-24 NOTE — ED Provider Notes (Signed)
[place] EMERGENCY DEPARTMENT AT [facility] [place] Provider Note   CSN: [id number] Arrival date & time: 09/24/22  1907     History Chief Complaint  Patient presents with   Head Injury    HPI Maysen Blackwelder is a 42 y.o. female presenting for chief complaint of headache.  42 year old female otherwise healthy up-to-date on vaccines. States that she was getting a bag out of her closet when it hit her on the head now she has a headache.  Versus 2 concussions in the past.  No other medical problems no anticoagulation.   Patient's recorded medical, surgical, social, medication list and allergies were reviewed in the Snapshot window as part of the initial history.   Review of Systems   Review of Systems  Constitutional:  Negative for chills and fever.  HENT:  Negative for ear pain and sore throat.   Eyes:  Negative for pain and visual disturbance.  Respiratory:  Negative for cough and shortness of breath.   Cardiovascular:  Negative for chest pain and palpitations.  Gastrointestinal:  Negative for abdominal pain and vomiting.  Genitourinary:  Negative for dysuria and hematuria.  Musculoskeletal:  Negative for arthralgias and back pain.  Skin:  Negative for color change and rash.  Neurological:  Positive for headaches. Negative for seizures and syncope.  All other systems reviewed and are negative.   Physical Exam Updated Vital Signs BP 123/82 (BP Location: Right Arm)   Pulse 81   Temp 98.5 F (36.9 C) (Oral)   Resp 16   Ht 5\' 6"  (1.676 m)   Wt 59 kg   SpO2 99%   BMI 20.98 kg/m  Physical Exam Vitals and nursing note reviewed.  Constitutional:      General: She is not in acute distress.    Appearance: She is well-developed.  HENT:     Head: Normocephalic and atraumatic.  Eyes:     Conjunctiva/sclera: Conjunctivae normal.  Cardiovascular:     Rate and Rhythm: Normal rate and regular rhythm.     Heart sounds: No murmur heard. Pulmonary:     Effort: Pulmonary  effort is normal. No respiratory distress.     Breath sounds: Normal breath sounds.  Abdominal:     General: There is no distension.     Palpations: Abdomen is soft.     Tenderness: There is no abdominal tenderness. There is no right CVA tenderness or left CVA tenderness.  Musculoskeletal:        General: Deformity (Significant bruising to the left frontal forehead.) present. No swelling or tenderness. Normal range of motion.     Cervical back: Neck supple.  Skin:    General: Skin is warm and dry.  Neurological:     General: No focal deficit present.     Mental Status: She is alert and oriented to person, place, and time. Mental status is at baseline.     Cranial Nerves: No cranial nerve deficit.      ED Course/ Medical Decision Making/ A&P    Procedures Procedures   Medications Ordered in ED Medications  ketorolac (TORADOL) 15 MG/ML injection 15 mg (15 mg Intramuscular Given 09/24/22 2025)   Medical Decision Making:    Jovee Earney is a 42 y.o. female who presented to the ED today with a moderate mechanisma trauma, detailed above.    Given this mechanism of trauma, a full physical exam was performed. Notably, patient was HDS in NAD.   Reviewed and confirmed nursing documentation for past medical history,  family history, social history.    Initial Assessment/Plan:   This is a patient presenting with a moderate mechanism trauma.  As such, I have considered intracranial injuries including intracranial hemorrhage, intrathoracic injuries including blunt myocardial or blunt lung injury, blunt abdominal injuries including aortic dissection, bladder injury, spleen injury, liver injury and I have considered orthopedic injuries including extremity or spinal injury.  With the patient's presentation of moderate mechanism trauma but an otherwise reassuring exam, patient warrants targeted evaluation for potential traumatic injuries. Will proceed with targeted evaluation for potential  injuries. Will proceed with CTH. Objective evaluation resulted with NAA.    Disposition:  I have considered need for hospitalization, however, considering all of the above, I believe this patient is stable for discharge at this time.  Patient/family educated about specific return precautions for given chief complaint and symptoms.  Patient/family educated about follow-up with PCP.     Patient/family expressed understanding of return precautions and need for follow-up. Patient spoken to regarding all imaging and laboratory results and appropriate follow up for these results. All education provided in verbal form with additional information in written form. Time was allowed for answering of patient questions. Patient discharged.    Emergency Department Medication Summary:   Medications  ketorolac (TORADOL) 15 MG/ML injection 15 mg (15 mg Intramuscular Given 09/24/22 2025)          Clinical Impression:  1. Injury of head, initial encounter      Discharge   Final Clinical Impression(s) / ED Diagnoses Final diagnoses:  Injury of head, initial encounter    Rx / DC Orders ED Discharge Orders     None         Tretha Sciara, MD 09/24/22 2041

## 2022-09-24 NOTE — ED Triage Notes (Signed)
Pt states she was getting something out of the closet yesterday evening and the door swung out and hit her in the head  Pt states her vision went dark but she did not pass out  Pt states she has been having visual disturbances since then  Pt states she has had some vomiting since as well

## 2023-01-10 ENCOUNTER — Emergency Department (HOSPITAL_COMMUNITY)
Admission: EM | Admit: 2023-01-10 | Discharge: 2023-01-10 | Disposition: A | Discharge status: Home / Self Care | Payer: Medicaid Other | Attending: Emergency Medicine | Admitting: Emergency Medicine

## 2023-01-10 ENCOUNTER — Encounter (HOSPITAL_COMMUNITY): Payer: Self-pay | Admitting: Emergency Medicine

## 2023-01-10 ENCOUNTER — Other Ambulatory Visit: Payer: Self-pay

## 2023-01-10 DIAGNOSIS — M5442 Lumbago with sciatica, left side: Secondary | ICD-10-CM | POA: Insufficient documentation

## 2023-01-10 DIAGNOSIS — Z91040 Latex allergy status: Secondary | ICD-10-CM | POA: Diagnosis not present

## 2023-01-10 DIAGNOSIS — M549 Dorsalgia, unspecified: Secondary | ICD-10-CM | POA: Diagnosis present

## 2023-01-10 DIAGNOSIS — G8929 Other chronic pain: Secondary | ICD-10-CM | POA: Insufficient documentation

## 2023-01-10 MED ORDER — METHOCARBAMOL 1000 MG/10ML IJ SOLN
1000.0000 mg | Freq: Once | INTRAMUSCULAR | Status: DC
  Filled 2023-01-10: qty 10

## 2023-01-10 MED ORDER — HYDROCODONE-ACETAMINOPHEN 7.5-325 MG/15ML PO SOLN: 15.0000 mL | Freq: Four times a day (QID) | ORAL | 0 refills | Status: AC | PRN

## 2023-01-10 MED ORDER — HYDROMORPHONE HCL 1 MG/ML IJ SOLN
1.0000 mg | Freq: Once | INTRAMUSCULAR | Status: AC
  Administered 2023-01-10: 1 mg via INTRAMUSCULAR
  Filled 2023-01-10: qty 1

## 2023-01-10 MED ORDER — LIDOCAINE 5 % EX PTCH: 1.0000 | MEDICATED_PATCH | CUTANEOUS | 0 refills | Status: AC

## 2023-01-10 MED ORDER — METHOCARBAMOL 500 MG PO TABS
1000.0000 mg | ORAL_TABLET | Freq: Once | ORAL | Status: AC
  Administered 2023-01-10: 1000 mg via ORAL
  Filled 2023-01-10: qty 2

## 2023-01-10 NOTE — ED Triage Notes (Addendum)
Pt presents for L sided low back pain radiating into L leg. She has been diagnosed with sciatica in the past. Pain worse for the past 2 days. "I just can't take it anymore." Denies numbness or tingling, fever, urinary sx. Worse with sitting and lying down. Pt unable to sit during triage.  Has tried unknown NSAID med at home without relief.

## 2023-01-10 NOTE — ED Provider Notes (Signed)
[place] EMERGENCY DEPARTMENT AT [facility] Provider Note   CSN: [id number] Arrival date & time: 01/10/23  4098     History  No chief complaint on file.   Christine Mccormick is a 42 y.o. female.  Patient with history of hypertension presents today with complaints of back pain.  She states that same has been a longstanding problem that she is dealt with for about 10 years.  She states that over the last few days she has had worsening pain consistent with Hyacinth Meeker flare she has had in the past.  She is seeing a Investment banker, operational, neurology, pain management, and her primary doctor for this has had interventions including physical therapy, medication management, and steroid injections with minimal relief.  She has been evaluated with CT scans, MRIs, and most recently a nerve conduction today that was performed on 6/5 which was unremarkable.  She states her pain is in her lower back and radiates down her left leg which is typical for her flares of this pain that has been ongoing for 10 years.  She denies any trauma.  No numbness or tingling in her extremities.  No loss of bowel or bladder function or saddle paresthesias.  No urinary symptoms, nausea, vomiting, or diarrhea.  Of note, she states that she has been prescribed numerous medications for pain but she does not normally take the medications prescribed to her as she has difficulty swallowing pills and 'I dont like the way medications make me feel even when they are in a liquid form or I can crush them up.'  She denies any history of malignancy or long-term steroid use.  The history is provided by the patient. No language interpreter was used.       Home Medications Prior to Admission medications   Not on File      Allergies    Latex    Review of Systems   Review of Systems  Musculoskeletal:  Positive for back pain.  All other systems reviewed and are negative.   Physical Exam Updated Vital Signs BP (!) 156/106 (BP  Location: Right Arm)   Pulse (!) 117   Temp 98.7 F (37.1 C) (Oral)   Resp 20   Wt 63.5 kg   SpO2 99%   BMI 22.60 kg/m  Physical Exam Vitals and nursing note reviewed.  Constitutional:      General: She is not in acute distress.    Appearance: Normal appearance. She is normal weight. She is not ill-appearing, toxic-appearing or diaphoretic.  HENT:     Head: Normocephalic and atraumatic.  Cardiovascular:     Rate and Rhythm: Normal rate.  Pulmonary:     Effort: Pulmonary effort is normal. No respiratory distress.  Abdominal:     General: Abdomen is flat.     Palpations: Abdomen is soft.     Tenderness: There is no abdominal tenderness.  Musculoskeletal:        General: Normal range of motion.     Cervical back: Normal range of motion.     Comments: TTP along the lumbar spine and associated paraspinous muscles and into the left hip area.  Patient observed to be ambulatory with steady gait.  5/5 strength and sensation intact in bilateral lower extremities and DP and PT pulses intact and 2+.  Skin:    General: Skin is warm and dry.  Neurological:     General: No focal deficit present.     Mental Status: She is alert.  Psychiatric:  Mood and Affect: Mood normal.        Behavior: Behavior normal.     ED Results / Procedures / Treatments   Labs (all labs ordered are listed, but only abnormal results are displayed) Labs Reviewed - No data to display  EKG None  Radiology No results found.  Procedures Procedures    Medications Ordered in ED Medications  HYDROmorphone (DILAUDID) injection 1 mg (has no administration in time range)  methocarbamol (ROBAXIN) injection 1,000 mg (has no administration in time range)    ED Course/ Medical Decision Making/ A&P                             Medical Decision Making Risk Prescription drug management.   This patient is a 42 y.o. female who presents to the ED for concern of back pain.   Differential diagnoses prior  to evaluation: Fracture (acute/chronic), muscle strain, cauda equina, spinal stenosis, DDD, ligamentous injury, disk herniation, metastatic cancer, vertebral osteomyelitis, kidney stone, pyelonephritis, AAA   Past Medical History / Social History / Additional history: Chart reviewed. Pertinent results include: Patient with history of hypertension.  Chart reviewed, patient has been seen by orthopedic surgery, pain management, and neurology for her back pain and has had a lumbar MRI in 2022, several CTs of her abdomen and pelvis, and NCS and EMG studies with her neurologist on 6/5 all of which has been normal  Physical Exam: Physical exam performed. The pertinent findings include: Per above, TTP along the lower lumbar spine and into the left sided paraspinous muscles as well.  Patient observed to be ambulatory with steady gait.  Medications / Treatment: Robaxin and dilaudid for pain   Disposition: After consideration of the diagnostic results and the patients response to treatment, I feel that emergency department workup does not suggest an emergent condition requiring admission or immediate intervention beyond what has been performed at this time. The plan is: codeine syrup for pain as patient states she cannot take pills.  PDMP reviewed, patient advised not to drive or operate heavy machinery while taking this medication.  Will also give lidocaine patches for pain as well. Evaluation and diagnostic testing in the emergency department does not suggest an emergent condition requiring admission or immediate intervention beyond what has been performed at this time.  Plan for discharge with close PCP follow-up.  Patient is understanding and amenable with plan, educated on red flag symptoms that would prompt immediate return.  Patient discharged in stable condition.  Final Clinical Impression(s) / ED Diagnoses Final diagnoses:  Chronic left-sided low back pain with left-sided sciatica    Rx / DC  Orders ED Discharge Orders          Ordered    HYDROcodone-acetaminophen (HYCET) 7.5-325 mg/15 ml solution  4 times daily PRN        01/10/23 0824    lidocaine (LIDODERM) 5 %  Every 24 hours        01/10/23 0824          An After Visit Summary was printed and given to the patient.     Silva Bandy, PA-C 01/10/23 0825    Rexford Maus, DO 01/10/23 [phone]

## 2023-01-10 NOTE — Discharge Instructions (Addendum)
Your back pain is most likely due to a muscular strain.  There is been a lot of research on back pain, unfortunately the only thing that seems to really help is Tylenol and ibuprofen.  Relative rest is also important to not lift greater than 10 pounds bending or twisting at the waist.  Please follow-up with your family physician.  The other thing that really seems to benefit patients is physical therapy which your doctor may send you for.  Please return to the emergency department for new numbness or weakness to your arms or legs. Difficulty with urinating or urinating or pooping on yourself.  Also if you cannot feel toilet paper when you wipe or get a fever.   Take 4 over the counter ibuprofen tablets 3 times a day or 2 over-the-counter naproxen tablets twice a day for pain. Also take tylenol 1000mg (2 extra strength) four times a day.   Additionally, I have given you a prescription for Hycet which is a narcotic cough syrup.  Take as prescribed for severe pain only.  Do not drive or operate heavy machinery while taking this medication as it can be sedating.  I have also given you lidocaine patches to wear on your back to help with your symptoms.  Return if development of any new or worsening symptoms.

## 2023-10-11 ENCOUNTER — Encounter (HOSPITAL_COMMUNITY): Payer: Self-pay

## 2023-10-11 ENCOUNTER — Emergency Department (HOSPITAL_COMMUNITY)

## 2023-10-11 ENCOUNTER — Emergency Department (HOSPITAL_COMMUNITY)
Admission: EM | Admit: 2023-10-11 | Discharge: 2023-10-11 | Disposition: A | Discharge status: Home / Self Care | Attending: Emergency Medicine | Admitting: Emergency Medicine

## 2023-10-11 ENCOUNTER — Other Ambulatory Visit: Payer: Self-pay

## 2023-10-11 DIAGNOSIS — R109 Unspecified abdominal pain: Secondary | ICD-10-CM | POA: Insufficient documentation

## 2023-10-11 DIAGNOSIS — I10 Essential (primary) hypertension: Secondary | ICD-10-CM | POA: Insufficient documentation

## 2023-10-11 DIAGNOSIS — M5442 Lumbago with sciatica, left side: Secondary | ICD-10-CM | POA: Diagnosis not present

## 2023-10-11 DIAGNOSIS — Z91040 Latex allergy status: Secondary | ICD-10-CM | POA: Diagnosis not present

## 2023-10-11 DIAGNOSIS — M545 Low back pain, unspecified: Secondary | ICD-10-CM | POA: Diagnosis present

## 2023-10-11 DIAGNOSIS — E039 Hypothyroidism, unspecified: Secondary | ICD-10-CM | POA: Diagnosis not present

## 2023-10-11 LAB — URINALYSIS, ROUTINE W REFLEX MICROSCOPIC
Bilirubin Urine: NEGATIVE
Glucose, UA: NEGATIVE mg/dL
Hgb urine dipstick: NEGATIVE
Ketones, ur: NEGATIVE mg/dL
Nitrite: NEGATIVE
Protein, ur: NEGATIVE mg/dL
Specific Gravity, Urine: 1.017 (ref 1.005–1.030)
pH: 7 (ref 5.0–8.0)

## 2023-10-11 LAB — PREGNANCY, URINE: Preg Test, Ur: NEGATIVE

## 2023-10-11 MED ORDER — ACETAMINOPHEN 160 MG/5ML PO SOLN
650.0000 mg | Freq: Once | ORAL | Status: AC
  Administered 2023-10-11: 650 mg via ORAL
  Filled 2023-10-11: qty 20.3

## 2023-10-11 MED ORDER — KETOROLAC TROMETHAMINE 30 MG/ML IJ SOLN
30.0000 mg | Freq: Once | INTRAMUSCULAR | Status: AC
  Administered 2023-10-11: 30 mg via INTRAVENOUS
  Filled 2023-10-11: qty 1

## 2023-10-11 MED ORDER — SODIUM CHLORIDE 0.9 % IV BOLUS
1000.0000 mL | Freq: Once | INTRAVENOUS | Status: AC
  Administered 2023-10-11: 1000 mL via INTRAVENOUS

## 2023-10-11 NOTE — ED Provider Notes (Signed)
 Patient is feeling better from a pain perspective.  She was able to ambulate to the bathroom.  No red flags to suggest spinal cord emergency.  This sounds like an acute on chronic problem she has been dealing with and follows with [facility] for.  CT does not show any acute emergent condition.  There is questionable diverticulitis but she has no abdominal pain or symptoms consistent with this and so I think antibiotics are not warranted.  Will have her follow-up with her spine specialist and will discharge home with return precautions.   Pricilla Loveless, MD 10/11/23 [phone]

## 2023-10-11 NOTE — Discharge Instructions (Addendum)
If you develop worsening, recurrent, or continued back pain, numbness or weakness in the legs, incontinence of your bowels or bladders, numbness of your buttocks, fever, abdominal pain, or any other new/concerning symptoms then return to the ER for evaluation.  

## 2023-10-11 NOTE — ED Provider Notes (Signed)
 [place] EMERGENCY DEPARTMENT AT [facility] Provider Note   CSN: [id number] Arrival date & time: 10/11/23  4098     History  Chief Complaint  Patient presents with   Flank Pain    Christine Mccormick is a 43 y.o. female.  The history is provided by the patient.  Flank Pain  She has history of hypertension, hypothyroidism, anemia, back pain and comes in with 1 week history of pain in the left lumbar area with some radiation to the right lumbar area and down her left leg.  Pain is more severe than her usual sciatica.  She does not recall any unusual trauma or unusual bending or lifting or twisting.  However, she was at a race and she stayed on concrete which did seem to make her pain worse.  She denies any weakness, numbness, tingling.  She denies any bowel or bladder dysfunction.  She did take 1 dose of the muscle relaxer (she thinks it was cyclobenzaprine) which put her to sleep but did not really help with her pain.  She has not taken any other medication or tried ice or heat.  Pain is worse when she bends over, but nothing seems to really help it.   Home Medications Prior to Admission medications   Medication Sig Start Date End Date Taking? Authorizing Provider  HYDROcodone-acetaminophen (HYCET) 7.5-325 mg/15 ml solution Take 15 mLs by mouth 4 (four) times daily as needed for moderate pain. 01/10/23 01/10/24  Smoot, Sarah A, PA-C  lidocaine (LIDODERM) 5 % Place 1 patch onto the skin daily. Remove & Discard patch within 12 hours or as directed by MD 01/10/23   Smoot, Shawn Route, PA-C      Allergies    Latex    Review of Systems   Review of Systems  Genitourinary:  Positive for flank pain.  All other systems reviewed and are negative.   Physical Exam Updated Vital Signs BP 120/72 (BP Location: Right Arm)   Pulse 93   Temp 98.2 F (36.8 C)   Resp 14   SpO2 100%  Physical Exam Vitals and nursing note reviewed.   43 year old female, resting comfortably and in no acute  distress. Vital signs are significant for borderline elevated blood pressure. Oxygen saturation is 100%, which is normal. Head is normocephalic and atraumatic. PERRLA, EOMI.  Back is tender in the mid lumbar spine with only minimal paralumbar tenderness bilaterally.  Straight leg raise is positive at 60 degrees bilaterally, but pain is felt in her posterior thigh and not in her back. Lungs are clear without rales, wheezes, or rhonchi. Chest is nontender. Heart has regular rate and rhythm without murmur. Abdomen is soft, flat, nontender. Extremities have no cyanosis or edema, full range of motion is present. Skin is warm and dry without rash. Neurologic: Mental status is normal, strength is 5/5 in all 4 extremities, sensory exam is normal.  ED Results / Procedures / Treatments   Labs (all labs ordered are listed, but only abnormal results are displayed) Labs Reviewed  URINALYSIS, ROUTINE W REFLEX MICROSCOPIC - Abnormal; Notable for the following components:      Result Value   APPearance CLOUDY (*)    Leukocytes,Ua TRACE (*)    Bacteria, UA RARE (*)    All other components within normal limits  PREGNANCY, URINE   Radiology No results found.  Procedures Procedures    Medications Ordered in ED Medications  sodium chloride 0.9 % bolus 1,000 mL (1,000 mLs Intravenous New  Bag/Given 10/11/23 0628)  ketorolac (TORADOL) 30 MG/ML injection 30 mg (30 mg Intravenous Given 10/11/23 0622)  acetaminophen (TYLENOL) 160 MG/5ML solution 650 mg (650 mg Oral Given 10/11/23 1610)    ED Course/ Medical Decision Making/ A&P                                 Medical Decision Making Amount and/or Complexity of Data Reviewed Labs: ordered. Radiology: ordered.  Risk OTC drugs. Prescription drug management.   Low back pain with left-sided sciatica.  Consider atypical presentation of kidney stone, UTI.  I have reviewed her past records, and she has no relevant past visits.  CT of abdomen and pelvis on  01/26/2021 showed no evidence of renal calculi.  I have ordered's renal stone protocol CT scan, urinalysis.  I have ordered IV fluids, ketorolac, oral acetaminophen.  Of note, patient states that she cannot swallow pills.  I have reviewed her laboratory test, my interpretation is normal urinalysis.  CT scan is pending.  Case is signed out to Dr. Criss Alvine.  Final Clinical Impression(s) / ED Diagnoses Final diagnoses:  Acute bilateral low back pain with left-sided sciatica    Rx / DC Orders ED Discharge Orders     None         Dione Booze, MD 10/11/23 [phone]

## 2023-10-11 NOTE — ED Triage Notes (Signed)
 Pt presents via POV c/o left sided flank pain. Reports feels like sciatica pain. Denies hx of kidney stones in the past. A&O x4. Ambulatory to triage.

## 2023-11-01 ENCOUNTER — Ambulatory Visit (HOSPITAL_BASED_OUTPATIENT_CLINIC_OR_DEPARTMENT_OTHER): Admitting: Physical Therapy

## 2023-11-02 ENCOUNTER — Telehealth (HOSPITAL_BASED_OUTPATIENT_CLINIC_OR_DEPARTMENT_OTHER): Payer: Self-pay | Admitting: Physical Therapy

## 2023-11-02 NOTE — Telephone Encounter (Signed)
 Called and spoke to patient to remind patient of upcoming physical therapy evaluation appointment. Pt confirmed appt and will be in attendance.

## 2023-11-03 ENCOUNTER — Ambulatory Visit (HOSPITAL_BASED_OUTPATIENT_CLINIC_OR_DEPARTMENT_OTHER): Attending: Nurse Practitioner | Admitting: Physical Therapy

## 2023-11-03 DIAGNOSIS — M79605 Pain in left leg: Secondary | ICD-10-CM | POA: Insufficient documentation

## 2023-11-03 DIAGNOSIS — M5459 Other low back pain: Secondary | ICD-10-CM | POA: Insufficient documentation

## 2023-11-03 NOTE — Therapy (Signed)
 OUTPATIENT PHYSICAL THERAPY EVALUATION   Patient Name: Christine Mccormick MRN: [id number] DOB:[birth year], 43 y.o., female Today's Date: 11/04/2023  END OF SESSION:  PT End of Session - 11/03/23 1433     Visit Number 1    Number of Visits 17    Date for PT Re-Evaluation 12/30/23    Authorization Type [place] [employer]    PT Start Time 1400    PT Stop Time 1433    PT Time Calculation (min) 33 min    Activity Tolerance Patient tolerated treatment well    Behavior During Therapy WFL for tasks assessed/performed             Past Medical History:  Diagnosis Date   Anemia    Blood transfusion without reported diagnosis    Colitis    Concussion    x2   Diverticulosis    Glaucoma    Heart murmur    Hypertension    Hypothyroidism    patient reported   Past Surgical History:  Procedure Laterality Date   abalation     COLONOSCOPY     CYST EXCISION     left wrist   DILATATION & CURETTAGE/HYSTEROSCOPY WITH MYOSURE     HYSTEROSCOPY     ROTATOR CUFF REPAIR     left shoulder   TUBAL LIGATION     There are no active problems to display for this patient.   PCP: Janifer Meigs FNP  REFERRING PROVIDER: Michae Aden, NP   REFERRING DIAG:  M53.3 (ICD-10-CM) - Sacrococcygeal disorders, not elsewhere classified  M25.552 (ICD-10-CM) - Pain in left hip    Rationale for Evaluation and Treatment: Rehabilitation  THERAPY DIAG:  Other low back pain  Pain in left leg  ONSET DATE: years ago   SUBJECTIVE:                                                                                                                                                                                           SUBJECTIVE STATEMENT: Had injections in hip without relief at PCP so went to [facility] spine specialists. Had injection with imaging and felt everything I was not supposed to feel. Did it differently and felt a little better, last time did not help (about 2 mo ago).  It feels  like a stabbing pain in Lt SIJ, hip and down leg. It used to be on both sides but now just the left leg and both sides of lower back.  Muscle relaxers make me drowsy, pain meds help because I can take a small dose during the day.  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 Sitting is painful, sometimes standing/walking helps a little.  Did PT a couple years ago and it was bad. Went to chiropractor but it was expensive and only helped for short term. Will never do TPDN again, has not tried massage.     PERTINENT HISTORY:  Chronic nature  PAIN:  Are you having pain? Yes: NPRS scale: severe Pain location: Lt SIJ & LE to foot Pain description: stabbing Aggravating factors: sitting Relieving factors: walking but only a little  PRECAUTIONS:  None  RED FLAGS: None   WEIGHT BEARING RESTRICTIONS:  No  FALLS:  Has patient fallen in last 6 months? 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  DIAGNOSTIC FINDINGS:  CT in ED on 4/1: CT does not show any acute emergent condition. There is questionable diverticulitis but she has no abdominal pain or symptoms consistent with this   PATIENT SURVEYS:  Modified Oswestry 20   POSTURE:  EVAL: Rt leg bowing at rest  GAIT: EVAL: demo gait WFL, good cadence   Body Part #1 Lumbar  PALPATION: EVAL: Rt SIJ limited mobility, spasm noted in musculature surrounding Lt hip- reported concordant pain upon palpation                                                                                                                              TREATMENT DATE:   Treatment                            4/25: Blank lines following charge title = not provided on this treatment date.   Manual:  TPDN No STM Lt Glut med PA spring mob Rt upper sacral quadrant There-ex: Supine piriformis  stretch There-Act: Single layer heel lift in left shoe Self Care:  Nuro-Re-ed:  Gait Training:     PATIENT EDUCATION:  Education details: Teacher, music of condition, POC, HEP, exercise form/rationale  Person educated: Patient Education method: Explanation, Demonstration, Tactile cues, and Verbal cues Education comprehension: verbalized understanding, returned demonstration, verbal cues required, tactile cues required, and needs further education  HOME EXERCISE PROGRAM: Wear heel lift, tennis ball STM to right hip, supine Rt figure 4   ASSESSMENT:  CLINICAL IMPRESSION: Patient is a 43 y.o. F who was seen today for physical therapy evaluation and treatment for chronic low back, SIJ, Lt LE pain. Pt has trialed multiple treatments without long term relief. 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 Requested that she wear the heel lift consistently and send me a message in the next day or 2 to let me know how it is feeling. Minimal tolerance to [facility] and pt verbalized possibly being open to TPDN in future appointments.  Has not tried tennis ball for self STM and will  work on that at home- tennis ball provided. Will benefit from skilled PT to decrease myofascial pain around back and hip as to progress into central stability program.     REHAB POTENTIAL: Fair chronic nature and multiple other treatments  CLINICAL DECISION MAKING: Stable/uncomplicated  EVALUATION COMPLEXITY: Low   GOALS: Goals reviewed with patient? Yes  SHORT TERM GOALS: Target date: 5/10  Determine need for heel lift Baseline: began wearing at eval Goal status: INITIAL  2.  Demo proper hip hinge  Baseline: compensations noted at eval upon standing from chair Goal status: INITIAL   LONG TERM GOALS: Target date: POC date  Able to sit for at least 45 min with minimal discomfort Baseline:  severe at eval Goal status: INITIAL  2.  Oswestry to improve by MDC Baseline:  see obj Goal status: INITIAL  3.  Independent with core stability program  Baseline: not established Goal status: INITIAL  4.  Bil LE strength within 10% of opposite LE Baseline: not appropriate to test at eval due to pain levels Goal status: INITIAL     PLAN:  PT FREQUENCY: 1-2x/week  PT DURATION: POC date  PLANNED INTERVENTIONS: 97164- PT Re-evaluation, 97750- Physical Performance Testing, 97110-Therapeutic exercises, 97530- Therapeutic activity, [id number]- Neuromuscular re-education, 97535- Self Care, 96045- Manual therapy, [phone]- Gait training, [phone]- Aquatic Therapy, Patient/Family education, Balance training, Stair training, Taping, Dry Needling, Joint mobilization, Spinal mobilization, Cryotherapy, and Moist heat.  PLAN FOR NEXT SESSION: continue STM around hip, outcome of heel lift?  [place] Fritze C. Naureen Benton PT, DPT 11/04/23 8:26 PM   For all possible CPT codes, reference the Planned Interventions line above.     Check all conditions that are expected to impact treatment: {Conditions expected to impact treatment:None of these apply   If treatment provided at initial evaluation, no treatment charged due to lack of authorization.

## 2023-11-04 ENCOUNTER — Other Ambulatory Visit: Payer: Self-pay

## 2023-11-04 ENCOUNTER — Encounter (HOSPITAL_BASED_OUTPATIENT_CLINIC_OR_DEPARTMENT_OTHER): Payer: Self-pay | Admitting: Physical Therapy

## 2023-11-09 ENCOUNTER — Ambulatory Visit (HOSPITAL_BASED_OUTPATIENT_CLINIC_OR_DEPARTMENT_OTHER): Admitting: Physical Therapy

## 2023-11-09 ENCOUNTER — Encounter (HOSPITAL_BASED_OUTPATIENT_CLINIC_OR_DEPARTMENT_OTHER): Payer: Self-pay | Admitting: Physical Therapy

## 2023-11-09 DIAGNOSIS — M5459 Other low back pain: Secondary | ICD-10-CM

## 2023-11-09 DIAGNOSIS — M79605 Pain in left leg: Secondary | ICD-10-CM

## 2023-11-09 NOTE — Therapy (Signed)
 OUTPATIENT PHYSICAL THERAPY TREATMENT   Patient Name: Christine Mccormick MRN: [id number] DOB:11/14/1980, 43 y.o., female Today's Date: 11/09/2023  END OF SESSION:  PT End of Session - 11/09/23 1126     Visit Number 2    Number of Visits 17    Date for PT Re-Evaluation 12/30/23    Authorization Type [place] [employer]    PT Start Time 1102    PT Stop Time 1130    PT Time Calculation (min) 28 min    Activity Tolerance Patient tolerated treatment well    Behavior During Therapy WFL for tasks assessed/performed              Past Medical History:  Diagnosis Date   Anemia    Blood transfusion without reported diagnosis    Colitis    Concussion    x2   Diverticulosis    Glaucoma    Heart murmur    Hypertension    Hypothyroidism    patient reported   Past Surgical History:  Procedure Laterality Date   abalation     COLONOSCOPY     CYST EXCISION     left wrist   DILATATION & CURETTAGE/HYSTEROSCOPY WITH MYOSURE     HYSTEROSCOPY     ROTATOR CUFF REPAIR     left shoulder   TUBAL LIGATION     There are no active problems to display for this patient.   PCP: Janifer Meigs FNP  REFERRING PROVIDER: Michae Aden, NP   REFERRING DIAG:  M53.3 (ICD-10-CM) - Sacrococcygeal disorders, not elsewhere classified  M25.552 (ICD-10-CM) - Pain in left hip    Rationale for Evaluation and Treatment: Rehabilitation  THERAPY DIAG:  Other low back pain  Pain in left leg  ONSET DATE: years ago   SUBJECTIVE:                                                                                                                                                                                           SUBJECTIVE STATEMENT: Pt states that she is having a really bad day today. She has a stabbing pain in her L glute and lower back.   Eval: Had injections in hip without relief at PCP so went to [facility] spine specialists. Had injection with imaging and felt everything I was  not supposed to feel. Did it differently and felt a little better, last time did not help (about 2 mo ago).  It feels like a stabbing pain in Lt SIJ, hip and down leg. It used to be on both sides but now just the left leg and both sides of lower back.  Muscle relaxers make me drowsy, pain meds help because I can take a small dose during the day.  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 Sitting is painful, sometimes standing/walking helps a little.  Did PT a couple years ago and it was bad. Went to chiropractor but it was expensive and only helped for short term. Will never do TPDN again, has not tried massage.     PERTINENT HISTORY:  Chronic nature  PAIN:  Are you having pain? Yes: NPRS scale: severe Pain location: Lt SIJ & LE to foot Pain description: stabbing Aggravating factors: sitting Relieving factors: walking but only a little  PRECAUTIONS:  None  RED FLAGS: None   WEIGHT BEARING RESTRICTIONS:  No  FALLS:  Has patient fallen in last 6 months? 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  DIAGNOSTIC FINDINGS:  CT in ED on 4/1: CT does not show any acute emergent condition. There is questionable diverticulitis but she has no abdominal pain or symptoms consistent with this   PATIENT SURVEYS:  Modified Oswestry 20   POSTURE:  EVAL: Rt leg bowing at rest  GAIT: EVAL: demo gait WFL, good cadence   Body Part #1 Lumbar  PALPATION: EVAL: Rt SIJ limited mobility, spasm noted in musculature surrounding Lt hip- reported concordant pain upon palpation                                                                                                                              TREATMENT DATE:   Treatment                            4/30 - TENS with bracketing of lower lumbar and glutes x 10  - Attempted to lay supine  and do LTR, and seated piriformis stretch with no success.  - Discussed next steps with PT.   Treatment                            4/25: Blank lines following charge title = not provided on this treatment date.   Manual:  TPDN No STM Lt Glut med PA spring mob Rt upper sacral quadrant There-ex: Supine piriformis stretch There-Act: Single layer heel lift in left shoe Self Care:  Nuro-Re-ed:  Gait Training:     PATIENT EDUCATION:  Education details: Teacher, music of condition, POC, HEP, exercise form/rationale  Person educated: Patient Education method: Explanation, Demonstration, Tactile cues, and Verbal cues Education comprehension: verbalized understanding, returned demonstration, verbal cues required, tactile cues required, and needs further education  HOME EXERCISE PROGRAM: Wear heel lift, tennis ball STM to right hip, supine Rt figure 4   ASSESSMENT:  CLINICAL IMPRESSION: Pt reports to PT with extreme reports of  pain. She states that she has been up since 4am and had a really rough night. She was unable to tolerate any skilled PT Today. Applied TENS unit to pt with reports of some relief. When we attempted to do some gentle stretching in multiple positions pt reported continued stabbing pain. She denied any further PT today due to pain level. Discussed plan to continue with PT in hopes of improvements over next couple of weeks. If no improvements seen, discussed going back to MD to get some pain relief to allow with continuation of PT to gain tissue mobility and strength. Pt will continue to benefit from skilled PT to address continued deficits.     Eval: Patient is a 43 y.o. F who was seen today for physical therapy evaluation and treatment for chronic low back, SIJ, Lt LE pain. Pt has trialed multiple treatments without long term relief. 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 Requested that she wear the heel lift consistently and  send me a message in the next day or 2 to let me know how it is feeling. Minimal tolerance to [facility] and pt verbalized possibly being open to TPDN in future appointments.  Has not tried tennis ball for self STM and will work on that at home- tennis ball provided. Will benefit from skilled PT to decrease myofascial pain around back and hip as to progress into central stability program.     REHAB POTENTIAL: Fair chronic nature and multiple other treatments  CLINICAL DECISION MAKING: Stable/uncomplicated  EVALUATION COMPLEXITY: Low   GOALS: Goals reviewed with patient? Yes  SHORT TERM GOALS: Target date: 5/10  Determine need for heel lift Baseline: began wearing at eval Goal status: INITIAL  2.  Demo proper hip hinge  Baseline: compensations noted at eval upon standing from chair Goal status: INITIAL   LONG TERM GOALS: Target date: POC date  Able to sit for at least 45 min with minimal discomfort Baseline:  severe at eval Goal status: INITIAL  2.  Oswestry to improve by MDC Baseline: see obj Goal status: INITIAL  3.  Independent with core stability program  Baseline: not established Goal status: INITIAL  4.  Bil LE strength within 10% of opposite LE Baseline: not appropriate to test at eval due to pain levels Goal status: INITIAL     PLAN:  PT FREQUENCY: 1-2x/week  PT DURATION: POC date  PLANNED INTERVENTIONS: 97164- PT Re-evaluation, 97750- Physical Performance Testing, 97110-Therapeutic exercises, 97530- Therapeutic activity, [id number]- Neuromuscular re-education, 97535- Self Care, 14782- Manual therapy, [phone]- Gait training, [phone]- Aquatic Therapy, Patient/Family education, Balance training, Stair training, Taping, Dry Needling, Joint mobilization, Spinal mobilization, Cryotherapy, and Moist heat.  PLAN FOR NEXT SESSION: continue STM around hip, outcome of heel lift?  Fredia Janus PT, DPT 11/09/23  11:36 AM

## 2023-11-17 ENCOUNTER — Encounter (HOSPITAL_BASED_OUTPATIENT_CLINIC_OR_DEPARTMENT_OTHER): Payer: Self-pay

## 2023-11-17 ENCOUNTER — Ambulatory Visit (HOSPITAL_BASED_OUTPATIENT_CLINIC_OR_DEPARTMENT_OTHER): Attending: Nurse Practitioner | Admitting: Physical Therapy

## 2023-11-17 DIAGNOSIS — M79605 Pain in left leg: Secondary | ICD-10-CM | POA: Insufficient documentation

## 2023-11-17 DIAGNOSIS — M5459 Other low back pain: Secondary | ICD-10-CM | POA: Insufficient documentation

## 2023-12-01 ENCOUNTER — Encounter (HOSPITAL_BASED_OUTPATIENT_CLINIC_OR_DEPARTMENT_OTHER): Payer: Self-pay | Admitting: Physical Therapy

## 2023-12-01 ENCOUNTER — Ambulatory Visit (HOSPITAL_BASED_OUTPATIENT_CLINIC_OR_DEPARTMENT_OTHER): Admitting: Physical Therapy

## 2023-12-01 DIAGNOSIS — M79605 Pain in left leg: Secondary | ICD-10-CM | POA: Diagnosis present

## 2023-12-01 DIAGNOSIS — M5459 Other low back pain: Secondary | ICD-10-CM

## 2023-12-01 NOTE — Therapy (Signed)
 OUTPATIENT PHYSICAL THERAPY TREATMENT   Patient Name: Christine Mccormick MRN: [id number] DOB:09-02-1980, 43 y.o., female Today's Date: 12/01/2023  END OF SESSION:  PT End of Session - 12/01/23 1018     Visit Number 3    Number of Visits 17    Date for PT Re-Evaluation 12/30/23    Authorization Type [place] [employer]    PT Start Time 1018    PT Stop Time 1056    PT Time Calculation (min) 38 min    Activity Tolerance Patient tolerated treatment well    Behavior During Therapy WFL for tasks assessed/performed              Past Medical History:  Diagnosis Date   Anemia    Blood transfusion without reported diagnosis    Colitis    Concussion    x2   Diverticulosis    Glaucoma    Heart murmur    Hypertension    Hypothyroidism    patient reported   Past Surgical History:  Procedure Laterality Date   abalation     COLONOSCOPY     CYST EXCISION     left wrist   DILATATION & CURETTAGE/HYSTEROSCOPY WITH MYOSURE     HYSTEROSCOPY     ROTATOR CUFF REPAIR     left shoulder   TUBAL LIGATION     There are no active problems to display for this patient.   PCP: Janifer Meigs FNP  REFERRING PROVIDER: Michae Aden, NP   REFERRING DIAG:  M53.3 (ICD-10-CM) - Sacrococcygeal disorders, not elsewhere classified  M25.552 (ICD-10-CM) - Pain in left hip    Rationale for Evaluation and Treatment: Rehabilitation  THERAPY DIAG:  Other low back pain  Pain in left leg  ONSET DATE: years ago   SUBJECTIVE:                                                                                                                                                                                           SUBJECTIVE STATEMENT: Pt states continued back pain worse today, some days worse than others. No change in symptoms with heel lift.   Eval: Had injections in hip without relief at PCP so went to [facility] spine specialists. Had injection with imaging and felt everything I  was not supposed to feel. Did it differently and felt a little better, last time did not help (about 2 mo ago).  It feels like a stabbing pain in Lt SIJ, hip and down leg. It used to be on both sides but now just the left leg and both sides of lower back.  Muscle relaxers make me  drowsy, pain meds help because I can take a small dose during the day.  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 Sitting is painful, sometimes standing/walking helps a little.  Did PT a couple years ago and it was bad. Went to chiropractor but it was expensive and only helped for short term. Will never do TPDN again, has not tried massage.     PERTINENT HISTORY:  Chronic nature  PAIN:  Are you having pain? Yes: NPRS scale: severe Pain location: Lt SIJ & LE to foot Pain description: stabbing Aggravating factors: sitting Relieving factors: walking but only a little  PRECAUTIONS:  None  RED FLAGS: None   WEIGHT BEARING RESTRICTIONS:  No  FALLS:  Has patient fallen in last 6 months? 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  DIAGNOSTIC FINDINGS:  CT in ED on 4/1: CT does not show any acute emergent condition. There is questionable diverticulitis but she has no abdominal pain or symptoms consistent with this   PATIENT SURVEYS:  Modified Oswestry 20   POSTURE:  EVAL: Rt leg bowing at rest  GAIT: EVAL: demo gait WFL, good cadence   Body Part #1 Lumbar  PALPATION: EVAL: Rt SIJ limited mobility, spasm noted in musculature surrounding Lt hip- reported concordant pain upon palpation                                                                                                                              TREATMENT DATE:  12/01/23 DKTC with heels on green ball 10 x 5 second holds Supine hip abduction isometric with belt 10 x 5-10 second  holds Manual: STM to lumbar paraspinals pre and post dry needling for trigger point identification and muscular relaxation. Trigger Point Dry Needling  Initial Treatment: Pt instructed on Dry Needling rational, procedures, and possible side effects. Pt instructed to expect mild to moderate muscle soreness later in the day and/or into the next day.  Pt instructed in methods to reduce muscle soreness. Pt instructed to continue prescribed HEP. Because Dry Needling was performed over or adjacent to a lung field, pt was educated on S/S of pneumothorax and to seek immediate medical attention should they occur.  Patient was educated on signs and symptoms of infection and other risk factors and advised to seek medical attention should they occur.  Patient verbalized understanding of these instructions and education.   Patient Verbal Consent Given: Yes Education Handout Provided: Yes Muscles Treated: lumbar paraspinals Electrical Stimulation Performed: No Treatment Response/Outcome: decrease in tissue tension, decrease in symptoms    Treatment                            4/30 - TENS with bracketing of lower lumbar and glutes x 10  -  Attempted to lay supine and do LTR, and seated piriformis stretch with no success.  - Discussed next steps with PT.   Treatment                            4/25: Blank lines following charge title = not provided on this treatment date.   Manual:  TPDN No STM Lt Glut med PA spring mob Rt upper sacral quadrant There-ex: Supine piriformis stretch There-Act: Single layer heel lift in left shoe Self Care:  Nuro-Re-ed:  Gait Training:     PATIENT EDUCATION:  Education details: Anatomy of condition, POC, HEP, exercise form/rationale 12/01/23: DN, stretches  Person educated: Patient Education method: Explanation, Demonstration, Tactile cues, and Verbal cues Education comprehension: verbalized understanding, returned demonstration, verbal cues required,  tactile cues required, and needs further education  HOME EXERCISE PROGRAM: Wear heel lift, tennis ball STM to right hip, supine Rt figure 4   ASSESSMENT:  CLINICAL IMPRESSION: Continued back pain with no change in symptoms up to this point. Completed core strengthening and spinal mobility exercises. Patient interested in dry needling, educated on DN. Performed to lumbar paraspinals with decrease in tissue tension and improvement in symptoms from 10/10 to 3/10. Patient will continue to benefit from physical therapy in order to improve function and reduce impairment.     Eval: Patient is a 43 y.o. F who was seen today for physical therapy evaluation and treatment for chronic low back, SIJ, Lt LE pain. Pt has trialed multiple treatments without long term relief. 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 Requested that she wear the heel lift consistently and send me a message in the next day or 2 to let me know how it is feeling. Minimal tolerance to [facility] and pt verbalized possibly being open to TPDN in future appointments.  Has not tried tennis ball for self STM and will work on that at home- tennis ball provided. Will benefit from skilled PT to decrease myofascial pain around back and hip as to progress into central stability program.     REHAB POTENTIAL: Fair chronic nature and multiple other treatments  CLINICAL DECISION MAKING: Stable/uncomplicated  EVALUATION COMPLEXITY: Low   GOALS: Goals reviewed with patient? Yes  SHORT TERM GOALS: Target date: 5/10  Determine need for heel lift Baseline: began wearing at eval Goal status: INITIAL  2.  Demo proper hip hinge  Baseline: compensations noted at eval upon standing from chair Goal status: INITIAL   LONG TERM GOALS: Target date: POC date  Able to sit for at least 45 min with minimal discomfort Baseline:  severe at eval Goal status: INITIAL  2.  Oswestry to improve by  MDC Baseline: see obj Goal status: INITIAL  3.  Independent with core stability program  Baseline: not established Goal status: INITIAL  4.  Bil LE strength within 10% of opposite LE Baseline: not appropriate to test at eval due to pain levels Goal status: INITIAL     PLAN:  PT FREQUENCY: 1-2x/week  PT DURATION: POC date  PLANNED INTERVENTIONS: 97164- PT Re-evaluation, 97750- Physical Performance Testing, 97110-Therapeutic exercises, 97530- Therapeutic activity, [id number]- Neuromuscular re-education, 97535- Self Care, 16109- Manual therapy, [phone]- Gait training, [phone]- Aquatic Therapy, Patient/Family education, Balance training, Stair training, Taping, Dry Needling, Joint mobilization, Spinal mobilization, Cryotherapy, and Moist heat.  PLAN FOR NEXT SESSION: continue STM around hip, core strength and spinal mobility, possibly DN?   Debria Fang  S Cletus Mehlhoff, PT, DPT 12/01/2023, 10:58 AM

## 2023-12-01 NOTE — Patient Instructions (Signed)

## 2023-12-07 ENCOUNTER — Ambulatory Visit (HOSPITAL_BASED_OUTPATIENT_CLINIC_OR_DEPARTMENT_OTHER): Admitting: Physical Therapy

## 2023-12-08 ENCOUNTER — Telehealth (HOSPITAL_BASED_OUTPATIENT_CLINIC_OR_DEPARTMENT_OTHER): Payer: Self-pay | Admitting: Physical Therapy

## 2023-12-08 NOTE — Telephone Encounter (Signed)
 Patient no show for appointment on 12/07/23. Patient had missed appointment on 11/17/23 and contacted clinic at that time due to realizing she missed her appointment. Patient reminded of next appointment and discussed attendance policy.   7:57 AM, 12/08/23 Beather Liming PT, DPT Physical Therapist at [facility]

## 2023-12-13 ENCOUNTER — Encounter (HOSPITAL_BASED_OUTPATIENT_CLINIC_OR_DEPARTMENT_OTHER): Payer: Self-pay | Admitting: Physical Therapy

## 2023-12-13 ENCOUNTER — Ambulatory Visit (HOSPITAL_BASED_OUTPATIENT_CLINIC_OR_DEPARTMENT_OTHER): Attending: Nurse Practitioner | Admitting: Physical Therapy

## 2023-12-13 DIAGNOSIS — M79605 Pain in left leg: Secondary | ICD-10-CM | POA: Insufficient documentation

## 2023-12-13 DIAGNOSIS — M5459 Other low back pain: Secondary | ICD-10-CM | POA: Diagnosis present

## 2023-12-13 NOTE — Therapy (Signed)
 OUTPATIENT PHYSICAL THERAPY TREATMENT   Patient Name: Christine Mccormick MRN: [id number] DOB:17-Sep-1980, 43 y.o., female Today's Date: 12/13/2023  END OF SESSION:  PT End of Session - 12/13/23 1501     Visit Number 4    Number of Visits 17    Date for PT Re-Evaluation 12/30/23    Authorization Type [place] [employer]    PT Start Time 1447    PT Stop Time 1525    PT Time Calculation (min) 38 min    Activity Tolerance Patient tolerated treatment well    Behavior During Therapy WFL for tasks assessed/performed              Past Medical History:  Diagnosis Date   Anemia    Blood transfusion without reported diagnosis    Colitis    Concussion    x2   Diverticulosis    Glaucoma    Heart murmur    Hypertension    Hypothyroidism    patient reported   Past Surgical History:  Procedure Laterality Date   abalation     COLONOSCOPY     CYST EXCISION     left wrist   DILATATION & CURETTAGE/HYSTEROSCOPY WITH MYOSURE     HYSTEROSCOPY     ROTATOR CUFF REPAIR     left shoulder   TUBAL LIGATION     There are no active problems to display for this patient.   PCP: Janifer Meigs FNP  REFERRING PROVIDER: Michae Aden, NP   REFERRING DIAG:  M53.3 (ICD-10-CM) - Sacrococcygeal disorders, not elsewhere classified  M25.552 (ICD-10-CM) - Pain in left hip    Rationale for Evaluation and Treatment: Rehabilitation  THERAPY DIAG:  Other low back pain  Pain in left leg  ONSET DATE: years ago   SUBJECTIVE:                                                                                                                                                                                           SUBJECTIVE STATEMENT: Pt states her pain was bad the night of last session, lasted for 3 days.   Eval: Had injections in hip without relief at PCP so went to [facility] spine specialists. Had injection with imaging and felt everything I was not supposed to feel. Did it  differently and felt a little better, last time did not help (about 2 mo ago).  It feels like a stabbing pain in Lt SIJ, hip and down leg. It used to be on both sides but now just the left leg and both sides of lower back.  Muscle relaxers make me drowsy, pain meds help  because I can take a small dose during the day.  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 Sitting is painful, sometimes standing/walking helps a little.  Did PT a couple years ago and it was bad. Went to chiropractor but it was expensive and only helped for short term. Will never do TPDN again, has not tried massage.     PERTINENT HISTORY:  Chronic nature  PAIN:  Are you having pain? Yes: NPRS scale: 4/10 Pain location: Lt hip Pain description: stabbing Aggravating factors: sitting Relieving factors: walking but only a little  PRECAUTIONS:  None  RED FLAGS: None   WEIGHT BEARING RESTRICTIONS:  No  FALLS:  Has patient fallen in last 6 months? 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  DIAGNOSTIC FINDINGS:  CT in ED on 4/1: CT does not show any acute emergent condition. There is questionable diverticulitis but she has no abdominal pain or symptoms consistent with this   PATIENT SURVEYS:  Modified Oswestry 20   POSTURE:  EVAL: Rt leg bowing at rest  GAIT: EVAL: demo gait WFL, good cadence   Body Part #1 Lumbar  PALPATION: EVAL: Rt SIJ limited mobility, spasm noted in musculature surrounding Lt hip- reported concordant pain upon palpation                                                                                                                              TREATMENT DATE:  [facility] Adult PT Treatment:                                            12/13/23 Pt seen for aquatic therapy today.  Treatment took place in water 3.5-4.75 ft in depth at the  [facility] pool. Temp of water was 91.  Pt entered/exited the pool via stairs independently with bilat rail.  - Intro to aquatic therapy principles - unsupported walking forward/ backward - unsupported side stepping R/L -> with rainbow hand floats and arm addct/abdct  - suitcase carry walking forward backward with single/ bilat hand floats - UE on wall:  hip add/abd x 5 each (painful in L hip for L hip abdct); hip ext to toe touch x 5 (painful on L); standing quad stretch, painful on L  - straddling noodle with UE support on corner: cycling, hip abdct/ addct; hip flex/ext (painful); cycling   Pt requires the buoyancy and hydrostatic pressure of water for support, and to offload joints by unweighting joint load by at least 50 % in navel deep water and by at least 75-80% in chest to neck deep water.  Viscosity of the water is needed for resistance of strengthening. Water current perturbations provides challenge to  standing balance requiring increased core activation.    12/01/23 DKTC with heels on green ball 10 x 5 second holds Supine hip abduction isometric with belt 10 x 5-10 second holds Manual: STM to lumbar paraspinals pre and post dry needling for trigger point identification and muscular relaxation. Trigger Point Dry Needling  Initial Treatment: Pt instructed on Dry Needling rational, procedures, and possible side effects. Pt instructed to expect mild to moderate muscle soreness later in the day and/or into the next day.  Pt instructed in methods to reduce muscle soreness. Pt instructed to continue prescribed HEP. Because Dry Needling was performed over or adjacent to a lung field, pt was educated on S/S of pneumothorax and to seek immediate medical attention should they occur.  Patient was educated on signs and symptoms of infection and other risk factors and advised to seek medical attention should they occur.  Patient verbalized understanding of these instructions and  education.   Patient Verbal Consent Given: Yes Education Handout Provided: Yes Muscles Treated: lumbar paraspinals Electrical Stimulation Performed: No Treatment Response/Outcome: decrease in tissue tension, decrease in symptoms    Treatment                            4/30 - TENS with bracketing of lower lumbar and glutes x 10  - Attempted to lay supine and do LTR, and seated piriformis stretch with no success.  - Discussed next steps with PT.   Treatment                            4/25: Blank lines following charge title = not provided on this treatment date.   Manual:  TPDN No STM Lt Glut med PA spring mob Rt upper sacral quadrant There-ex: Supine piriformis stretch There-Act: Single layer heel lift in left shoe Self Care:  Nuro-Re-ed:  Gait Training:     PATIENT EDUCATION:  Education details: Anatomy of condition, POC, HEP, exercise form/rationale 12/01/23: DN, stretches  Person educated: Patient Education method: Explanation, Demonstration, Tactile cues, and Verbal cues Education comprehension: verbalized understanding, returned demonstration, verbal cues required, tactile cues required, and needs further education  HOME EXERCISE PROGRAM: Wear heel lift, tennis ball STM to right hip, supine Rt figure 4   ASSESSMENT:  CLINICAL IMPRESSION: Pt demonstrates safety and independence in aquatic setting with therapist instructing from deck. Pt demonstrates confidence in setting, moving throughout all depths easily.  Pt is directed through various movement patterns and trials in standing position. She did not tolerate L hip ext or hip abdct at wall due to sharp pain in ant hip/abdomen, but did tolerate suspended hip abdct and side stepping well. Will assess psoas tightness and trial MFR with ball to area before entering pool next session.   Goals are ongoing.   Patient will continue to benefit from physical therapy in order to improve function and reduce  impairment.     Eval: Patient is a 43 y.o. F who was seen today for physical therapy evaluation and treatment for chronic low back, SIJ, Lt LE pain. Pt has trialed multiple treatments without long term relief. 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 Requested that she wear the heel lift consistently and send me a message in the next day or 2 to let me know how it is feeling. Minimal tolerance to [facility] and pt verbalized possibly being open to  TPDN in future appointments.  Has not tried tennis ball for self STM and will work on that at home- tennis ball provided. Will benefit from skilled PT to decrease myofascial pain around back and hip as to progress into central stability program.     REHAB POTENTIAL: Fair chronic nature and multiple other treatments  CLINICAL DECISION MAKING: Stable/uncomplicated  EVALUATION COMPLEXITY: Low   GOALS: Goals reviewed with patient? Yes  SHORT TERM GOALS: Target date: 5/10  Determine need for heel lift Baseline: began wearing at eval Goal status: INITIAL  2.  Demo proper hip hinge  Baseline: compensations noted at eval upon standing from chair Goal status: INITIAL   LONG TERM GOALS: Target date: POC date  Able to sit for at least 45 min with minimal discomfort Baseline:  severe at eval Goal status: INITIAL  2.  Oswestry to improve by MDC Baseline: see obj Goal status: INITIAL  3.  Independent with core stability program  Baseline: not established Goal status: INITIAL  4.  Bil LE strength within 10% of opposite LE Baseline: not appropriate to test at eval due to pain levels Goal status: INITIAL     PLAN:  PT FREQUENCY: 1-2x/week  PT DURATION: POC date  PLANNED INTERVENTIONS: 97164- PT Re-evaluation, 97750- Physical Performance Testing, 97110-Therapeutic exercises, 97530- Therapeutic activity, [id number]- Neuromuscular re-education, 97535- Self Care, 03500- Manual therapy, [phone]- Gait  training, [phone]- Aquatic Therapy, Patient/Family education, Balance training, Stair training, Taping, Dry Needling, Joint mobilization, Spinal mobilization, Cryotherapy, and Moist heat.  PLAN FOR NEXT SESSION: continue STM around hip, core strength and spinal mobility, possibly DN?  Almedia Jacobsen, PTA 12/13/23 5:58 PM [facility] [address] [place], [place], [address] Phone: [phone]   Fax:  [phone]

## 2023-12-15 ENCOUNTER — Ambulatory Visit (HOSPITAL_BASED_OUTPATIENT_CLINIC_OR_DEPARTMENT_OTHER): Admitting: Physical Therapy

## 2023-12-15 ENCOUNTER — Encounter (HOSPITAL_BASED_OUTPATIENT_CLINIC_OR_DEPARTMENT_OTHER): Payer: Self-pay | Admitting: Physical Therapy

## 2023-12-15 DIAGNOSIS — M79605 Pain in left leg: Secondary | ICD-10-CM

## 2023-12-15 DIAGNOSIS — M5459 Other low back pain: Secondary | ICD-10-CM | POA: Diagnosis not present

## 2023-12-15 NOTE — Therapy (Signed)
 OUTPATIENT PHYSICAL THERAPY TREATMENT   Patient Name: Christine Mccormick MRN: [id number] DOB:February 01, 1981, 43 y.o., female Today's Date: 12/15/2023  END OF SESSION:  PT End of Session - 12/15/23 1450     Visit Number 5    Number of Visits 17    Date for PT Re-Evaluation 12/30/23    Authorization Type [place] [employer]    PT Start Time 1446    PT Stop Time 1525    PT Time Calculation (min) 39 min    Behavior During Therapy WFL for tasks assessed/performed              Past Medical History:  Diagnosis Date   Anemia    Blood transfusion without reported diagnosis    Colitis    Concussion    x2   Diverticulosis    Glaucoma    Heart murmur    Hypertension    Hypothyroidism    patient reported   Past Surgical History:  Procedure Laterality Date   abalation     COLONOSCOPY     CYST EXCISION     left wrist   DILATATION & CURETTAGE/HYSTEROSCOPY WITH MYOSURE     HYSTEROSCOPY     ROTATOR CUFF REPAIR     left shoulder   TUBAL LIGATION     There are no active problems to display for this patient.   PCP: Janifer Meigs FNP  REFERRING PROVIDER: Michae Aden, NP   REFERRING DIAG:  M53.3 (ICD-10-CM) - Sacrococcygeal disorders, not elsewhere classified  M25.552 (ICD-10-CM) - Pain in left hip    Rationale for Evaluation and Treatment: Rehabilitation  THERAPY DIAG:  Other low back pain  Pain in left leg  ONSET DATE: years ago   SUBJECTIVE:                                                                                                                                                                                           SUBJECTIVE STATEMENT: Pt states she had some relief after last session.      Eval: Had injections in hip without relief at PCP so went to [facility] spine specialists. Had injection with imaging and felt everything I was not supposed to feel. Did it differently and felt a little better, last time did not help (about 2 mo ago).   It feels like a stabbing pain in Lt SIJ, hip and down leg. It used to be on both sides but now just the left leg and both sides of lower back.  Muscle relaxers make me drowsy, pain meds help because I can take a small dose during the day.  I  sit at a desk 10 hr a day. Sitting is painful, sometimes standing/walking helps a little.  Did PT a couple years ago and it was bad. Went to chiropractor but it was expensive and only helped for short term. Will never do TPDN again, has not tried massage.     PERTINENT HISTORY:  Chronic nature  PAIN:  Are you having pain? Yes: NPRS scale: 3/10 Pain location: Lt hip Pain description: stabbing Aggravating factors: sitting Relieving factors: walking but only a little  PRECAUTIONS:  None  RED FLAGS: None   WEIGHT BEARING RESTRICTIONS:  No  FALLS:  Has patient fallen in last 6 months? 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  DIAGNOSTIC FINDINGS:  CT in ED on 4/1: CT does not show any acute emergent condition. There is questionable diverticulitis but she has no abdominal pain or symptoms consistent with this   PATIENT SURVEYS:  Modified Oswestry 20   POSTURE:  EVAL: Rt leg bowing at rest  GAIT: EVAL: demo gait WFL, good cadence   Body Part #1 Lumbar  PALPATION: EVAL: Rt SIJ limited mobility, spasm noted in musculature surrounding Lt hip- reported concordant pain upon palpation                                                                                                                              TREATMENT DATE:  [facility] Adult PT Treatment:                                            12/15/23 Pt seen for aquatic therapy today.  Treatment took place in water 3.5-4.75 ft in depth at the [facility] pool. Temp of water was 91.  Pt entered/exited the pool via  stairs independently with bilat rail.  - unsupported walking forward/ backward - cues for even step length (takes short steps with LLE) - unsupported side stepping R/L -> with rainbow hand floats and arm addct/abdct  - suitcase carry walking forward backward with  bilat hand floats - UE on rainbow hand floats: SLS x ~8 sec each;  3 way toe touch x 5 each LE; hip abdct/ addct x 5 each  - squats with/without UE support on hand floats - TrA set with 1/2 hollow -> full hollow pull down to thighs in wide and staggered stance x 5 each - standard stance with reciprocal arm swing with light resistance bells  -> walking forward with arm swing with bells   [facility] Adult PT Treatment:  12/13/23 Pt seen for aquatic therapy today.  Treatment took place in water 3.5-4.75 ft in depth at the [facility] pool. Temp of water was 91.  Pt entered/exited the pool via stairs independently with bilat rail.  - Intro to aquatic therapy principles - unsupported walking forward/ backward - unsupported side stepping R/L -> with rainbow hand floats and arm addct/abdct  - suitcase carry walking forward backward with single/ bilat hand floats - UE on wall:  hip add/abd x 5 each (painful in L hip for L hip abdct); hip ext to toe touch x 5 (painful on L); standing quad stretch, painful on L  - straddling noodle with UE support on corner: cycling, hip abdct/ addct; hip flex/ext (painful); cycling   Pt requires the buoyancy and hydrostatic pressure of water for support, and to offload joints by unweighting joint load by at least 50 % in navel deep water and by at least 75-80% in chest to neck deep water.  Viscosity of the water is needed for resistance of strengthening. Water current perturbations provides challenge to standing balance requiring increased core activation.    12/01/23 DKTC with heels on green ball 10 x 5 second holds Supine hip abduction isometric with belt 10 x  5-10 second holds Manual: STM to lumbar paraspinals pre and post dry needling for trigger point identification and muscular relaxation. Trigger Point Dry Needling  Initial Treatment: Pt instructed on Dry Needling rational, procedures, and possible side effects. Pt instructed to expect mild to moderate muscle soreness later in the day and/or into the next day.  Pt instructed in methods to reduce muscle soreness. Pt instructed to continue prescribed HEP. Because Dry Needling was performed over or adjacent to a lung field, pt was educated on S/S of pneumothorax and to seek immediate medical attention should they occur.  Patient was educated on signs and symptoms of infection and other risk factors and advised to seek medical attention should they occur.  Patient verbalized understanding of these instructions and education.   Patient Verbal Consent Given: Yes Education Handout Provided: Yes Muscles Treated: lumbar paraspinals Electrical Stimulation Performed: No Treatment Response/Outcome: decrease in tissue tension, decrease in symptoms    Treatment                            4/30 - TENS with bracketing of lower lumbar and glutes x 10  - Attempted to lay supine and do LTR, and seated piriformis stretch with no success.  - Discussed next steps with PT.   Treatment                            4/25: Blank lines following charge title = not provided on this treatment date.   Manual:  TPDN No STM Lt Glut med PA spring mob Rt upper sacral quadrant There-ex: Supine piriformis stretch There-Act: Single layer heel lift in left shoe Self Care:  Nuro-Re-ed:  Gait Training:     PATIENT EDUCATION:  Education details: Anatomy of condition, POC, HEP, exercise form/rationale 12/01/23: DN, stretches  Person educated: Patient Education method: Explanation, Demonstration, Tactile cues, and Verbal cues Education comprehension: verbalized understanding, returned demonstration, verbal cues  required, tactile cues required, and needs further education  HOME EXERCISE PROGRAM: Wear heel lift, tennis ball STM to right hip, supine Rt figure 4   ASSESSMENT:  CLINICAL IMPRESSION: Improved step length and tolerance for hip abdct today.  She demonstrates decreased balance in SLS in water; will benefit from further work on this. No increase in pain during session, just fatigue.  Will assess psoas tightness and trial MFR with ball to area before entering pool next session.   Goals are ongoing.   Patient will continue to benefit from physical therapy in order to improve function and reduce impairment.     Eval: Patient is a 43 y.o. F who was seen today for physical therapy evaluation and treatment for chronic low back, SIJ, Lt LE pain. Pt has trialed multiple treatments without long term relief. 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 Requested that she wear the heel lift consistently and send me a message in the next day or 2 to let me know how it is feeling. Minimal tolerance to [facility] and pt verbalized possibly being open to TPDN in future appointments.  Has not tried tennis ball for self STM and will work on that at home- tennis ball provided. Will benefit from skilled PT to decrease myofascial pain around back and hip as to progress into central stability program.     REHAB POTENTIAL: Fair chronic nature and multiple other treatments  CLINICAL DECISION MAKING: Stable/uncomplicated  EVALUATION COMPLEXITY: Low   GOALS: Goals reviewed with patient? Yes  SHORT TERM GOALS: Target date: 5/10  Determine need for heel lift Baseline: began wearing at eval Goal status: INITIAL  2.  Demo proper hip hinge  Baseline: compensations noted at eval upon standing from chair Goal status: INITIAL   LONG TERM GOALS: Target date: POC date  Able to sit for at least 45 min with minimal discomfort Baseline:  severe at eval Goal status:  INITIAL  2.  Oswestry to improve by MDC Baseline: see obj Goal status: INITIAL  3.  Independent with core stability program  Baseline: not established Goal status: INITIAL  4.  Bil LE strength within 10% of opposite LE Baseline: not appropriate to test at eval due to pain levels Goal status: INITIAL     PLAN:  PT FREQUENCY: 1-2x/week  PT DURATION: POC date  PLANNED INTERVENTIONS: 97164- PT Re-evaluation, 97750- Physical Performance Testing, 97110-Therapeutic exercises, 97530- Therapeutic activity, [id number]- Neuromuscular re-education, 97535- Self Care, 16109- Manual therapy, [phone]- Gait training, [phone]- Aquatic Therapy, Patient/Family education, Balance training, Stair training, Taping, Dry Needling, Joint mobilization, Spinal mobilization, Cryotherapy, and Moist heat.  PLAN FOR NEXT SESSION: continue STM around hip, core strength and spinal mobility, possibly DN?  Almedia Jacobsen, PTA 12/15/23 5:43 PM [facility] [address] [place], [place], [address] Phone: [phone]   Fax:  [phone]

## 2023-12-16 ENCOUNTER — Encounter (HOSPITAL_BASED_OUTPATIENT_CLINIC_OR_DEPARTMENT_OTHER): Payer: Self-pay | Admitting: *Deleted

## 2023-12-16 ENCOUNTER — Emergency Department (HOSPITAL_BASED_OUTPATIENT_CLINIC_OR_DEPARTMENT_OTHER)
Admission: EM | Admit: 2023-12-16 | Discharge: 2023-12-16 | Disposition: A | Discharge status: Home / Self Care | Attending: Emergency Medicine | Admitting: Emergency Medicine

## 2023-12-16 ENCOUNTER — Other Ambulatory Visit: Payer: Self-pay

## 2023-12-16 DIAGNOSIS — S00212A Abrasion of left eyelid and periocular area, initial encounter: Secondary | ICD-10-CM | POA: Diagnosis not present

## 2023-12-16 DIAGNOSIS — E039 Hypothyroidism, unspecified: Secondary | ICD-10-CM | POA: Diagnosis not present

## 2023-12-16 DIAGNOSIS — W228XXA Striking against or struck by other objects, initial encounter: Secondary | ICD-10-CM | POA: Diagnosis not present

## 2023-12-16 DIAGNOSIS — I10 Essential (primary) hypertension: Secondary | ICD-10-CM | POA: Diagnosis not present

## 2023-12-16 DIAGNOSIS — H5712 Ocular pain, left eye: Secondary | ICD-10-CM

## 2023-12-16 DIAGNOSIS — S0592XA Unspecified injury of left eye and orbit, initial encounter: Secondary | ICD-10-CM | POA: Diagnosis present

## 2023-12-16 MED ORDER — FLUORESCEIN SODIUM 1 MG OP STRP
1.0000 | ORAL_STRIP | Freq: Once | OPHTHALMIC | Status: AC
  Administered 2023-12-16: 1 via OPHTHALMIC
  Filled 2023-12-16: qty 1

## 2023-12-16 MED ORDER — ACETAMINOPHEN 160 MG/5ML PO SOLN
650.0000 mg | Freq: Once | ORAL | Status: AC
  Administered 2023-12-16: 650 mg via ORAL
  Filled 2023-12-16: qty 20.3

## 2023-12-16 MED ORDER — TETRACAINE HCL 0.5 % OP SOLN
2.0000 [drp] | Freq: Once | OPHTHALMIC | Status: AC
  Administered 2023-12-16: 2 [drp] via OPHTHALMIC
  Filled 2023-12-16: qty 4

## 2023-12-16 MED ORDER — KETOROLAC TROMETHAMINE 60 MG/2ML IM SOLN
30.0000 mg | Freq: Once | INTRAMUSCULAR | Status: AC
  Administered 2023-12-16: 30 mg via INTRAMUSCULAR
  Filled 2023-12-16: qty 2

## 2023-12-16 NOTE — ED Provider Notes (Signed)
 [place] [facility] Provider Note  CSN: [id number] Arrival date & time: 12/16/23 0009  Chief Complaint(s) Eye Injury  HPI Christine Mccormick is a 43 y.o. female here with left eye pain and blurry vision following trauma to the eye.  Patient reports hitting herself in the eye with her car door.  This occurred just prior to arrival.  She was able to drive herself here.  She wears contacts.  Reports that she sees "spiderweb" floater and intermittent double vision of the left eye.  The history is provided by the patient.    Past Medical History Past Medical History:  Diagnosis Date   Anemia    Blood transfusion without reported diagnosis    Colitis    Concussion    x2   Diverticulosis    Glaucoma    Heart murmur    Hypertension    Hypothyroidism    patient reported   There are no active problems to display for this patient.  Home Medication(s) Prior to Admission medications   Medication Sig Start Date End Date Taking? Authorizing Provider  cyclobenzaprine (FLEXERIL) 10 MG tablet Take 10 mg by mouth 2 (two) times daily as needed. 07/22/23   [provider]  HYDROcodone -acetaminophen  (HYCET) 7.5-325 mg/15 ml solution Take 15 mLs by mouth 4 (four) times daily as needed for moderate pain. Patient not taking: Reported on 10/11/2023 01/10/23 01/10/24  Smoot, Genevive Ket, PA-C  lidocaine  (LIDODERM ) 5 % Place 1 patch onto the skin daily. Remove & Discard patch within 12 hours or as directed by MD 01/10/23   Smoot, Genevive Ket, PA-C                                                                                                                                    Allergies Latex  Review of Systems Review of Systems As noted in HPI  Physical Exam Vital Signs  I have reviewed the triage vital signs BP (!) 132/101 (BP Location: Right Arm)   Pulse 74   Temp 98.6 F (37 C) (Oral)   Resp 16   SpO2 99%   Physical Exam Vitals reviewed.  Constitutional:       General: She is not in acute distress.    Appearance: She is well-developed. She is not diaphoretic.  HENT:     Head: Normocephalic and atraumatic.     Right Ear: External ear normal.     Left Ear: External ear normal.     Nose: Nose normal.  Eyes:     General: No scleral icterus.    Intraocular pressure: Right eye pressure is 18 mmHg. Left eye pressure is 20 mmHg. Measurements were taken using an automated tonometer.    Extraocular Movements: Extraocular movements intact.     Conjunctiva/sclera: Conjunctivae normal.     Right eye: Right conjunctiva is not injected. No exudate or hemorrhage.    Left eye: No exudate  or hemorrhage.    Pupils: Pupils are equal, round, and reactive to light.     Left eye: No corneal abrasion or fluorescein uptake. Seidel exam negative.    Funduscopic exam:       Left eye: No papilledema.     Slit lamp exam:    Right eye: No photophobia.     Left eye: No hyphema or photophobia.     Comments: Superficial linear abrasion to the center of left upper lid  Neck:     Trachea: Phonation normal.  Cardiovascular:     Rate and Rhythm: Regular rhythm.  Pulmonary:     Effort: Pulmonary effort is normal. No respiratory distress.     Breath sounds: No stridor.  Abdominal:     General: There is no distension.  Musculoskeletal:        General: Normal range of motion.     Cervical back: Normal range of motion.  Neurological:     Mental Status: She is alert and oriented to person, place, and time.  Psychiatric:        Behavior: Behavior normal.     ED Results and Treatments Labs (all labs ordered are listed, but only abnormal results are displayed) Labs Reviewed - No data to display                                                                                                                       EKG  EKG Interpretation Date/Time:    Ventricular Rate:    PR Interval:    QRS Duration:    QT Interval:    QTC Calculation:   R Axis:      Text  Interpretation:         Radiology No results found.  Medications Ordered in ED Medications  fluorescein ophthalmic strip 1 strip (1 strip Left Eye Given by Other 12/16/23 0140)  tetracaine (PONTOCAINE) 0.5 % ophthalmic solution 2 drop (2 drops Left Eye Given by Other 12/16/23 0140)  ketorolac  (TORADOL ) injection 30 mg (30 mg Intramuscular Given 12/16/23 0230)  acetaminophen  (TYLENOL ) 160 MG/5ML solution 650 mg (650 mg Oral Given 12/16/23 0228)   Procedures Procedures  (including critical care time) Medical Decision Making / ED Course   Medical Decision Making Risk OTC drugs. Prescription drug management.    Pain to left eye following trauma. No evidence of obvious trauma on exam noted above including corneal abrasion, subconjunctival hemorrhage, hyphema.  Exam not concerning for globe rupture or retrobulbar hematoma.  Given the floater in intermittent double vision, possible retinal detachment versus vitreous hemorrhage considered.  Patient reports already being established with an ophthalmologist.  Recommend she call in the morning to set up close follow-up for more thorough ocular exam.    Final Clinical Impression(s) / ED Diagnoses Final diagnoses:  Eye pain, left  Abrasion of left upper eyelid   The patient appears reasonably screened and/or stabilized for discharge and I doubt any other medical condition or other [facility] requiring further screening,  evaluation, or treatment in the ED at this time. I have discussed the findings, Dx and Tx plan with the patient/family who expressed understanding and agree(s) with the plan. Discharge instructions discussed at length. The patient/family was given strict return precautions who verbalized understanding of the instructions. No further questions at time of discharge.  Disposition: Discharge  Condition: Good  ED Discharge Orders     None       Follow Up: Ophthalmology   Please call your Ophthalmologist for a walk in check  up  Greg Leaks, MD [address] [place] [address] [phone]   If you cannot reach your ophthalmologist, please call Dr. Josefa Ni.    This chart was dictated using voice recognition software.  Despite best efforts to proofread,  errors can occur which can change the documentation meaning.    Lindle Rhea, MD 12/16/23 [phone]

## 2023-12-16 NOTE — ED Triage Notes (Signed)
 Pt was opening the car door and hit herself on the left side of her face. Abrasion noted to left eyelid. Blurred vision in left eye. C/o dizziness. Denies LOC; hx of concussion (currently in PT)

## 2023-12-19 ENCOUNTER — Encounter (HOSPITAL_BASED_OUTPATIENT_CLINIC_OR_DEPARTMENT_OTHER): Payer: Self-pay | Admitting: Physical Therapy

## 2023-12-19 ENCOUNTER — Ambulatory Visit (HOSPITAL_BASED_OUTPATIENT_CLINIC_OR_DEPARTMENT_OTHER): Admitting: Physical Therapy

## 2023-12-19 DIAGNOSIS — M79605 Pain in left leg: Secondary | ICD-10-CM

## 2023-12-19 DIAGNOSIS — M5459 Other low back pain: Secondary | ICD-10-CM | POA: Diagnosis not present

## 2023-12-19 NOTE — Therapy (Signed)
 OUTPATIENT PHYSICAL THERAPY TREATMENT   Patient Name: Christine Mccormick MRN: [id number] DOB:February 23, 1981, 43 y.o., female Today's Date: 12/19/2023  END OF SESSION:  PT End of Session - 12/19/23 1211     Visit Number 6    Number of Visits 17    Date for PT Re-Evaluation 12/30/23    Authorization Type [employer]    Authorization - Visit Number 6    Authorization - Number of Visits 27    PT Start Time 1146    PT Stop Time 1226    PT Time Calculation (min) 40 min    Behavior During Therapy WFL for tasks assessed/performed              Past Medical History:  Diagnosis Date   Anemia    Blood transfusion without reported diagnosis    Colitis    Concussion    x2   Diverticulosis    Glaucoma    Heart murmur    Hypertension    Hypothyroidism    patient reported   Past Surgical History:  Procedure Laterality Date   abalation     COLONOSCOPY     CYST EXCISION     left wrist   DILATATION & CURETTAGE/HYSTEROSCOPY WITH MYOSURE     HYSTEROSCOPY     ROTATOR CUFF REPAIR     left shoulder   TUBAL LIGATION     There are no active problems to display for this patient.   PCP: Janifer Meigs FNP  REFERRING PROVIDER: Michae Aden, NP   REFERRING DIAG:  M53.3 (ICD-10-CM) - Sacrococcygeal disorders, not elsewhere classified  M25.552 (ICD-10-CM) - Pain in left hip    Rationale for Evaluation and Treatment: Rehabilitation  THERAPY DIAG:  Other low back pain  Pain in left leg  ONSET DATE: years ago   SUBJECTIVE:                                                                                                                                                                                           SUBJECTIVE STATEMENT: Pt states she had some relief after last session.      Eval: Had injections in hip without relief at PCP so went to [facility] spine specialists. Had injection with imaging and felt everything I was not supposed to feel. Did it  differently and felt a little better, last time did not help (about 2 mo ago).  It feels like a stabbing pain in Lt SIJ, hip and down leg. It used to be on both sides but now just the left leg and both sides of lower back.  Muscle relaxers make  me drowsy, pain meds help because I can take a small dose during the day.  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 Sitting is painful, sometimes standing/walking helps a little.  Did PT a couple years ago and it was bad. Went to chiropractor but it was expensive and only helped for short term. Will never do TPDN again, has not tried massage.     PERTINENT HISTORY:  Chronic nature  PAIN:  Are you having pain? Yes: NPRS scale: 3/10 Pain location: Lt hip Pain description: stabbing Aggravating factors: sitting Relieving factors: walking but only a little  PRECAUTIONS:  None  RED FLAGS: None   WEIGHT BEARING RESTRICTIONS:  No  FALLS:  Has patient fallen in last 6 months? 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  DIAGNOSTIC FINDINGS:  CT in ED on 4/1: CT does not show any acute emergent condition. There is questionable diverticulitis but she has no abdominal pain or symptoms consistent with this   PATIENT SURVEYS:  Modified Oswestry 20   POSTURE:  EVAL: Rt leg bowing at rest  GAIT: EVAL: demo gait WFL, good cadence   Body Part #1 Lumbar  PALPATION: EVAL: Rt SIJ limited mobility, spasm noted in musculature surrounding Lt hip- reported concordant pain upon palpation                                                                                                                              TREATMENT DATE:  [facility] Adult PT Treatment:                                            12/19/23 Manual therapy: MFR to L iliopsoas (limited tolerance);  STM/IASTM to L ant/lateral thigh to  decrease fascial restrictions.   Pt seen for aquatic therapy today.  Treatment took place in water 3.5-4.75 ft in depth at the [facility] pool. Temp of water was 91.  Pt entered/exited the pool via stairs independently with bilat rail.  - unsupported walking forward/ backward - cues for even step length (takes short steps with LLE) - tandem gait forward/ backward with yellow hand floats x 2 laps - side stepping R/L with yellow hand floats  and arm addct/abdct (increased pain to 6/10)-> performed without resistance with improved tolerance - straddling noodle with UE support on corner: cycling (some relief) , hip abdct/ addct [facility] Adult PT Treatment:                                            12/15/23 Pt seen for aquatic therapy  today.  Treatment took place in water 3.5-4.75 ft in depth at the [facility] pool. Temp of water was 91.  Pt entered/exited the pool via stairs independently with bilat rail.  - unsupported walking forward/ backward - cues for even step length (takes short steps with LLE) - unsupported side stepping R/L -> with rainbow hand floats and arm addct/abdct  - suitcase carry walking forward backward with  bilat hand floats - UE on rainbow hand floats: SLS x ~8 sec each;  3 way toe touch x 5 each LE; hip abdct/ addct x 5 each  - squats with/without UE support on hand floats - TrA set with 1/2 hollow -> full hollow pull down to thighs in wide and staggered stance x 5 each - standard stance with reciprocal arm swing with light resistance bells  -> walking forward with arm swing with bells   [facility] Adult PT Treatment:                                            12/13/23 Pt seen for aquatic therapy today.  Treatment took place in water 3.5-4.75 ft in depth at the [facility] pool. Temp of water was 91.  Pt entered/exited the pool via stairs independently with bilat rail.  - Intro to aquatic therapy principles - unsupported walking forward/ backward -  unsupported side stepping R/L -> with rainbow hand floats and arm addct/abdct  - suitcase carry walking forward backward with single/ bilat hand floats - UE on wall:  hip add/abd x 5 each (painful in L hip for L hip abdct); hip ext to toe touch x 5 (painful on L); standing quad stretch, painful on L  - straddling noodle with UE support on corner: cycling, hip abdct/ addct; hip flex/ext (painful); cycling   Pt requires the buoyancy and hydrostatic pressure of water for support, and to offload joints by unweighting joint load by at least 50 % in navel deep water and by at least 75-80% in chest to neck deep water.  Viscosity of the water is needed for resistance of strengthening. Water current perturbations provides challenge to standing balance requiring increased core activation.    12/01/23 DKTC with heels on green ball 10 x 5 second holds Supine hip abduction isometric with belt 10 x 5-10 second holds Manual: STM to lumbar paraspinals pre and post dry needling for trigger point identification and muscular relaxation. Trigger Point Dry Needling  Initial Treatment: Pt instructed on Dry Needling rational, procedures, and possible side effects. Pt instructed to expect mild to moderate muscle soreness later in the day and/or into the next day.  Pt instructed in methods to reduce muscle soreness. Pt instructed to continue prescribed HEP. Because Dry Needling was performed over or adjacent to a lung field, pt was educated on S/S of pneumothorax and to seek immediate medical attention should they occur.  Patient was educated on signs and symptoms of infection and other risk factors and advised to seek medical attention should they occur.  Patient verbalized understanding of these instructions and education.   Patient Verbal Consent Given: Yes Education Handout Provided: Yes Muscles Treated: lumbar paraspinals Electrical Stimulation Performed: No Treatment Response/Outcome: decrease in tissue  tension, decrease in symptoms    Treatment  4/30 - TENS with bracketing of lower lumbar and glutes x 10  - Attempted to lay supine and do LTR, and seated piriformis stretch with no success.  - Discussed next steps with PT.   Treatment                            4/25: Blank lines following charge title = not provided on this treatment date.   Manual:  TPDN No STM Lt Glut med PA spring mob Rt upper sacral quadrant There-ex: Supine piriformis stretch There-Act: Single layer heel lift in left shoe Self Care:  Nuro-Re-ed:  Gait Training:     PATIENT EDUCATION:  Education details: Anatomy of condition, POC, HEP, exercise form/rationale 12/01/23: DN, stretches  Person educated: Patient Education method: Explanation, Demonstration, Tactile cues, and Verbal cues Education comprehension: verbalized understanding, returned demonstration, verbal cues required, tactile cues required, and needs further education  HOME EXERCISE PROGRAM: Wear heel lift, tennis ball STM to right hip, supine Rt figure 4   ASSESSMENT:  CLINICAL IMPRESSION: Limited tolerance to light touch with MFR to L ant hip.  Palpable tightness along ant fascial chain.  Tolerated light IASTM with some discomfort.   Improved step length in water with LLE forward/ backward with cues. Did not tolerate side stepping with UE resistance today.  Some relief of symptoms with suspended cycling. Goals are ongoing. Therapist to ck STG2 at next visit (hip hinge)   Patient will continue to benefit from physical therapy in order to improve function and reduce impairment.     Eval: Patient is a 43 y.o. F who was seen today for physical therapy evaluation and treatment for chronic low back, SIJ, Lt LE pain. Pt has trialed multiple treatments without long term relief. 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 Requested that she wear the heel lift consistently and  send me a message in the next day or 2 to let me know how it is feeling. Minimal tolerance to [facility] and pt verbalized possibly being open to TPDN in future appointments.  Has not tried tennis ball for self STM and will work on that at home- tennis ball provided. Will benefit from skilled PT to decrease myofascial pain around back and hip as to progress into central stability program.     REHAB POTENTIAL: Fair chronic nature and multiple other treatments  CLINICAL DECISION MAKING: Stable/uncomplicated  EVALUATION COMPLEXITY: Low   GOALS: Goals reviewed with patient? Yes  SHORT TERM GOALS: Target date: 5/10  Determine need for heel lift Baseline: began wearing at eval Goal status: INITIAL  2.  Demo proper hip hinge  Baseline: compensations noted at eval upon standing from chair Goal status: INITIAL   LONG TERM GOALS: Target date: POC date  Able to sit for at least 45 min with minimal discomfort Baseline:  severe at eval Goal status: INITIAL  2.  Oswestry to improve by MDC Baseline: see obj Goal status: INITIAL  3.  Independent with core stability program  Baseline: not established Goal status: INITIAL  4.  Bil LE strength within 10% of opposite LE Baseline: not appropriate to test at eval due to pain levels Goal status: INITIAL     PLAN:  PT FREQUENCY: 1-2x/week  PT DURATION: POC date  PLANNED INTERVENTIONS: 97164- PT Re-evaluation, 97750- Physical Performance Testing, 97110-Therapeutic exercises, 97530- Therapeutic activity, [id number]- Neuromuscular re-education, 97535- Self Care, [address]- Manual therapy, [id number]- Gait training, [phone]-  Aquatic Therapy, Patient/Family education, Balance training, Stair training, Taping, Dry Needling, Joint mobilization, Spinal mobilization, Cryotherapy, and Moist heat.  PLAN FOR NEXT SESSION: continue STM around hip, core strength and spinal mobility, possibly DN?  Almedia Jacobsen, PTA 12/19/23 12:44 PM [facility]  [facility] [address] [place], [place], [address] Phone: [phone]   Fax:  [phone]

## 2023-12-21 ENCOUNTER — Encounter (HOSPITAL_BASED_OUTPATIENT_CLINIC_OR_DEPARTMENT_OTHER): Payer: Self-pay | Admitting: Physical Therapy

## 2023-12-21 ENCOUNTER — Ambulatory Visit (HOSPITAL_BASED_OUTPATIENT_CLINIC_OR_DEPARTMENT_OTHER): Admitting: Physical Therapy

## 2023-12-21 DIAGNOSIS — M5459 Other low back pain: Secondary | ICD-10-CM | POA: Diagnosis not present

## 2023-12-21 DIAGNOSIS — M79605 Pain in left leg: Secondary | ICD-10-CM

## 2023-12-21 NOTE — Therapy (Signed)
 OUTPATIENT PHYSICAL THERAPY TREATMENT   Patient Name: Christine Mccormick MRN: [id number] DOB:01/08/1981, 43 y.o., female Today's Date: 12/21/2023  END OF SESSION:  PT End of Session - 12/21/23 1501     Visit Number 7    Number of Visits 17    Date for PT Re-Evaluation 12/30/23    Authorization Type [place] [employer]    Authorization - Number of Visits 27    PT Start Time 1446    PT Stop Time 1525    PT Time Calculation (min) 39 min    Activity Tolerance Patient tolerated treatment well    Behavior During Therapy WFL for tasks assessed/performed               Past Medical History:  Diagnosis Date   Anemia    Blood transfusion without reported diagnosis    Colitis    Concussion    x2   Diverticulosis    Glaucoma    Heart murmur    Hypertension    Hypothyroidism    patient reported   Past Surgical History:  Procedure Laterality Date   abalation     COLONOSCOPY     CYST EXCISION     left wrist   DILATATION & CURETTAGE/HYSTEROSCOPY WITH MYOSURE     HYSTEROSCOPY     ROTATOR CUFF REPAIR     left shoulder   TUBAL LIGATION     There are no active problems to display for this patient.   PCP: Janifer Meigs FNP  REFERRING PROVIDER: Michae Aden, NP   REFERRING DIAG:  M53.3 (ICD-10-CM) - Sacrococcygeal disorders, not elsewhere classified  M25.552 (ICD-10-CM) - Pain in left hip    Rationale for Evaluation and Treatment: Rehabilitation  THERAPY DIAG:  Other low back pain  Pain in left leg  ONSET DATE: years ago   SUBJECTIVE:                                                                                                                                                                                           SUBJECTIVE STATEMENT: Pt states manual last session didn't really help but it didn't make it worse    Eval: Had injections in hip without relief at PCP so went to [facility] spine specialists. Had injection with imaging and felt  everything I was not supposed to feel. Did it differently and felt a little better, last time did not help (about 2 mo ago).  It feels like a stabbing pain in Lt SIJ, hip and down leg. It used to be on both sides but now just the left leg and both sides of lower  back.  Muscle relaxers make me drowsy, pain meds help because I can take a small dose during the day.  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 Sitting is painful, sometimes standing/walking helps a little.  Did PT a couple years ago and it was bad. Went to chiropractor but it was expensive and only helped for short term. Will never do TPDN again, has not tried massage.     PERTINENT HISTORY:  Chronic nature  PAIN:  Are you having pain? Yes: NPRS scale: 3/10 Pain location: Lt hip Pain description: stabbing Aggravating factors: sitting Relieving factors: walking but only a little  PRECAUTIONS:  None  RED FLAGS: None   WEIGHT BEARING RESTRICTIONS:  No  FALLS:  Has patient fallen in last 6 months? 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  DIAGNOSTIC FINDINGS:  CT in ED on 4/1: CT does not show any acute emergent condition. There is questionable diverticulitis but she has no abdominal pain or symptoms consistent with this   PATIENT SURVEYS:  Modified Oswestry 20   POSTURE:  EVAL: Rt leg bowing at rest  GAIT: EVAL: demo gait WFL, good cadence   Body Part #1 Lumbar  PALPATION: EVAL: Rt SIJ limited mobility, spasm noted in musculature surrounding Lt hip- reported concordant pain upon palpation                                                                                                                              TREATMENT DATE:  [facility] Adult PT Treatment:                                            12/21/23 Pt seen for aquatic therapy today.  Treatment  took place in water 3.5-4.75 ft in depth at the [facility] pool. Temp of water was 91.  Pt entered/exited the pool via stairs independently with bilat rail.  - unsupported walking forward/ backward - cues for even step length (takes short steps with LLE) - side stepping R/L 4.60ft ue add/abd unsupported - tandem gait forward/ backward with yellow hand floats x 4 laps - TrA set with 1/2 hollow pull down to thighs in wide and staggered stance x 10 each - straddling noodle cycling across pool ue support yellow HB x 4 laps; with UE support on corner: hip abdct/ addct x15; hip flex/ext x15 - suitcase carry walking forward backward with  bilat hand floats  Pt requires the buoyancy and hydrostatic pressure of water for support, and to offload joints by unweighting joint load by at least 50 % in navel deep water and by at least 75-80% in chest to neck deep water.  Viscosity of the water  is needed for resistance of strengthening. Water current perturbations provides challenge to standing balance requiring increased core activation.  [facility] Adult PT Treatment:                                            12/19/23 Manual therapy: MFR to L iliopsoas (limited tolerance);  STM/IASTM to L ant/lateral thigh to decrease fascial restrictions.   Pt seen for aquatic therapy today.  Treatment took place in water 3.5-4.75 ft in depth at the [facility] pool. Temp of water was 91.  Pt entered/exited the pool via stairs independently with bilat rail.  - unsupported walking forward/ backward - cues for even step length (takes short steps with LLE) - tandem gait forward/ backward with yellow hand floats x 2 laps - side stepping R/L with yellow hand floats  and arm addct/abdct (increased pain to 6/10)-> performed without resistance with improved tolerance - straddling noodle with UE support on corner: cycling (some relief) , hip abdct/ addct [facility] Adult PT Treatment:                                             12/15/23 Pt seen for aquatic therapy today.  Treatment took place in water 3.5-4.75 ft in depth at the [facility] pool. Temp of water was 91.  Pt entered/exited the pool via stairs independently with bilat rail.  - unsupported walking forward/ backward - cues for even step length (takes short steps with LLE) - unsupported side stepping R/L -> with rainbow hand floats and arm addct/abdct  - suitcase carry walking forward backward with  bilat hand floats - UE on rainbow hand floats: SLS x ~8 sec each;  3 way toe touch x 5 each LE; hip abdct/ addct x 5 each  - squats with/without UE support on hand floats - TrA set with 1/2 hollow -> full hollow pull down to thighs in wide and staggered stance x 5 each - standard stance with reciprocal arm swing with light resistance bells  -> walking forward with arm swing with bells   [facility] Adult PT Treatment:                                            12/13/23 Pt seen for aquatic therapy today.  Treatment took place in water 3.5-4.75 ft in depth at the [facility] pool. Temp of water was 91.  Pt entered/exited the pool via stairs independently with bilat rail.  - Intro to aquatic therapy principles - unsupported walking forward/ backward - unsupported side stepping R/L -> with rainbow hand floats and arm addct/abdct  - suitcase carry walking forward backward with single/ bilat hand floats - UE on wall:  hip add/abd x 5 each (painful in L hip for L hip abdct); hip ext to toe touch x 5 (painful on L); standing quad stretch, painful on L  - straddling noodle with UE support on corner: cycling, hip abdct/ addct; hip flex/ext (painful); cycling   Pt requires the buoyancy and hydrostatic pressure of water for support, and to offload joints by unweighting joint load by at least 50 % in navel deep  water and by at least 75-80% in chest to neck deep water.  Viscosity of the water is needed for resistance of strengthening. Water current perturbations  provides challenge to standing balance requiring increased core activation.    12/01/23 DKTC with heels on green ball 10 x 5 second holds Supine hip abduction isometric with belt 10 x 5-10 second holds Manual: STM to lumbar paraspinals pre and post dry needling for trigger point identification and muscular relaxation. Trigger Point Dry Needling  Initial Treatment: Pt instructed on Dry Needling rational, procedures, and possible side effects. Pt instructed to expect mild to moderate muscle soreness later in the day and/or into the next day.  Pt instructed in methods to reduce muscle soreness. Pt instructed to continue prescribed HEP. Because Dry Needling was performed over or adjacent to a lung field, pt was educated on S/S of pneumothorax and to seek immediate medical attention should they occur.  Patient was educated on signs and symptoms of infection and other risk factors and advised to seek medical attention should they occur.  Patient verbalized understanding of these instructions and education.   Patient Verbal Consent Given: Yes Education Handout Provided: Yes Muscles Treated: lumbar paraspinals Electrical Stimulation Performed: No Treatment Response/Outcome: decrease in tissue tension, decrease in symptoms    Treatment                            4/30 - TENS with bracketing of lower lumbar and glutes x 10  - Attempted to lay supine and do LTR, and seated piriformis stretch with no success.  - Discussed next steps with PT.   Treatment                            4/25: Blank lines following charge title = not provided on this treatment date.   Manual:  TPDN No STM Lt Glut med PA spring mob Rt upper sacral quadrant There-ex: Supine piriformis stretch There-Act: Single layer heel lift in left shoe Self Care:  Nuro-Re-ed:  Gait Training:     PATIENT EDUCATION:  Education details: Anatomy of condition, POC, HEP, exercise form/rationale 12/01/23: DN, stretches  Person  educated: Patient Education method: Explanation, Demonstration, Tactile cues, and Verbal cues Education comprehension: verbalized understanding, returned demonstration, verbal cues required, tactile cues required, and needs further education  HOME EXERCISE PROGRAM: Wear heel lift, tennis ball STM to right hip, supine Rt figure 4   ASSESSMENT:  CLINICAL IMPRESSION: Pt reports having eyes dilated this morning and continues to have sensitivity to light.  She tolerates session fair in a somber mood throughout. All movements are slow and guarded.  No reduction in pain with session although she comes in today stating pain is lower than usual. She has no adverse reaction to any exercise. She is going to be on vacation from work for the next 10 days and states she will be making a few trips. Goals ongoing Therapist to ck STG2 at next visit (hip hinge)    Eval: Patient is a 43 y.o. F who was seen today for physical therapy evaluation and treatment for chronic low back, SIJ, Lt LE pain. Pt has trialed multiple treatments without long term relief. 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 Requested that she wear the heel lift consistently and send me a message in the next day or 2 to let me know how  it is feeling. Minimal tolerance to [facility] and pt verbalized possibly being open to TPDN in future appointments.  Has not tried tennis ball for self STM and will work on that at home- tennis ball provided. Will benefit from skilled PT to decrease myofascial pain around back and hip as to progress into central stability program.     REHAB POTENTIAL: Fair chronic nature and multiple other treatments  CLINICAL DECISION MAKING: Stable/uncomplicated  EVALUATION COMPLEXITY: Low   GOALS: Goals reviewed with patient? Yes  SHORT TERM GOALS: Target date: 5/10  Determine need for heel lift Baseline: began wearing at eval Goal status: INITIAL  2.  Demo proper hip  hinge  Baseline: compensations noted at eval upon standing from chair Goal status: INITIAL   LONG TERM GOALS: Target date: POC date  Able to sit for at least 45 min with minimal discomfort Baseline:  severe at eval Goal status: INITIAL  2.  Oswestry to improve by MDC Baseline: see obj Goal status: INITIAL  3.  Independent with core stability program  Baseline: not established Goal status: INITIAL  4.  Bil LE strength within 10% of opposite LE Baseline: not appropriate to test at eval due to pain levels Goal status: INITIAL     PLAN:  PT FREQUENCY: 1-2x/week  PT DURATION: POC date  PLANNED INTERVENTIONS: 97164- PT Re-evaluation, 97750- Physical Performance Testing, 97110-Therapeutic exercises, 97530- Therapeutic activity, [id number]- Neuromuscular re-education, 97535- Self Care, 57846- Manual therapy, [phone]- Gait training, [phone]- Aquatic Therapy, Patient/Family education, Balance training, Stair training, Taping, Dry Needling, Joint mobilization, Spinal mobilization, Cryotherapy, and Moist heat.  PLAN FOR NEXT SESSION: continue STM around hip, core strength and spinal mobility, possibly DN  Adriana Hopping Laneta Pintos) Meilyn Heindl MPT 12/21/23 3:12 PM [facility] [address] [place], [place], [address] Phone: [phone]   Fax:  [phone]

## 2024-01-03 ENCOUNTER — Ambulatory Visit (HOSPITAL_BASED_OUTPATIENT_CLINIC_OR_DEPARTMENT_OTHER): Admitting: Physical Therapy

## 2024-01-03 ENCOUNTER — Encounter (HOSPITAL_BASED_OUTPATIENT_CLINIC_OR_DEPARTMENT_OTHER): Payer: Self-pay | Admitting: Physical Therapy

## 2024-01-03 DIAGNOSIS — M79605 Pain in left leg: Secondary | ICD-10-CM

## 2024-01-03 DIAGNOSIS — M5459 Other low back pain: Secondary | ICD-10-CM | POA: Diagnosis not present

## 2024-01-03 NOTE — Therapy (Signed)
 OUTPATIENT PHYSICAL THERAPY TREATMENT Progress Note Reporting Period 11/03/22 to 01/03/23  See note below for Objective Data and Assessment of Progress/Goals.      Patient Name: Christine Mccormick MRN: [id number] DOB:02-09-1981, 43 y.o., female Today's Date: 01/03/2024  END OF SESSION:  PT End of Session - 01/03/24 1533     Visit Number 8    Number of Visits 17    Date for PT Re-Evaluation 03/02/24    Authorization Type [place] [employer]    Authorization - Number of Visits 27    PT Start Time 1534    PT Stop Time 1615    PT Time Calculation (min) 41 min    Activity Tolerance Patient tolerated treatment well    Behavior During Therapy WFL for tasks assessed/performed            Past Medical History:  Diagnosis Date   Anemia    Blood transfusion without reported diagnosis    Colitis    Concussion    x2   Diverticulosis    Glaucoma    Heart murmur    Hypertension    Hypothyroidism    patient reported   Past Surgical History:  Procedure Laterality Date   abalation     COLONOSCOPY     CYST EXCISION     left wrist   DILATATION & CURETTAGE/HYSTEROSCOPY WITH MYOSURE     HYSTEROSCOPY     ROTATOR CUFF REPAIR     left shoulder   TUBAL LIGATION     There are no active problems to display for this patient.   PCP: Leita Jama Norse FNP  REFERRING PROVIDER: Daris Merlynn BRAVO, NP   REFERRING DIAG:  M53.3 (ICD-10-CM) - Sacrococcygeal disorders, not elsewhere classified  M25.552 (ICD-10-CM) - Pain in left hip    Rationale for Evaluation and Treatment: Rehabilitation  THERAPY DIAG:  Other low back pain  Pain in left leg  ONSET DATE: years ago   SUBJECTIVE:                                                                                                                                                                                           SUBJECTIVE STATEMENT: Pt has been in hospital for past 4 days with dtr.  Pain sensitivity increased along  with reports of tiredness    Eval: Had injections in hip without relief at PCP so went to [facility] spine specialists. Had injection with imaging and felt everything I was not supposed to feel. Did it differently and felt a little better, last time did not help (about 2 mo ago).  It feels like a stabbing pain in  Lt SIJ, hip and down leg. It used to be on both sides but now just the left leg and both sides of lower back.  Muscle relaxers make me drowsy, pain meds help because I can take a small dose during the day.  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 Sitting is painful, sometimes standing/walking helps a little.  Did PT a couple years ago and it was bad. Went to chiropractor but it was expensive and only helped for short term. Will never do TPDN again, has not tried massage.     PERTINENT HISTORY:  Chronic nature  PAIN:  Are you having pain? Yes: NPRS scale: 3/10 Pain location: Lt hip Pain description: stabbing Aggravating factors: sitting Relieving factors: walking but only a little  PRECAUTIONS:  None  RED FLAGS: None   WEIGHT BEARING RESTRICTIONS:  No  FALLS:  Has patient fallen in last 6 months? 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  DIAGNOSTIC FINDINGS:  CT in ED on 4/1: CT does not show any acute emergent condition. There is questionable diverticulitis but she has no abdominal pain or symptoms consistent with this   PATIENT SURVEYS:  Modified Oswestry 20   POSTURE:  EVAL: Rt leg bowing at rest  GAIT: EVAL: demo gait WFL, good cadence   Body Part #1 Lumbar  PALPATION: EVAL: Rt SIJ limited mobility, spasm noted in musculature surrounding Lt hip- reported concordant pain upon palpation  01/03/24  LUMBAR ROM:  WFL  HD muscle testing Right hip flex 24.0  left 23.0 Right hip abd 15.4  left 9.1                                                                                                                             TREATMENT DATE:  [facility] Adult PT Treatment:                                            3/75/74  Re-cert testing   Pt seen for aquatic therapy today.  Treatment took place in water 3.5-4.75 ft in depth at the [facility] pool. Temp of water was 91.  Pt entered/exited the pool via stairs independently with bilat rail.  - unsupported walking forward/ backward - cues for even step length (takes short steps with LLE) - side stepping R/L 4.79ft ue add/abd unsupported - suitcase carry walking forward backward with  bilat hand floats - TrA set with 1/2 hollow pull down to thighs in wide and staggered stance x 10 each - straddling noodle cycling across pool ue support yellow HB x 4 laps; with UE support on corner: hip abdct/ addct x15; hip flex/ext x15 - tandem gait forward/ backward  with yellow hand floats x 4 laps  Pt requires the buoyancy and hydrostatic pressure of water for support, and to offload joints by unweighting joint load by at least 50 % in navel deep water and by at least 75-80% in chest to neck deep water.  Viscosity of the water is needed for resistance of strengthening. Water current perturbations provides challenge to standing balance requiring increased core activation.    Prior land session 12/01/23 DKTC with heels on green ball 10 x 5 second holds Supine hip abduction isometric with belt 10 x 5-10 second holds Manual: STM to lumbar paraspinals pre and post dry needling for trigger point identification and muscular relaxation. Trigger Point Dry Needling  Initial Treatment: Pt instructed on Dry Needling rational, procedures, and possible side effects. Pt instructed to expect mild to moderate muscle soreness later in the day and/or into the next day.  Pt instructed in methods to reduce muscle soreness. Pt instructed to continue prescribed HEP. Because  Dry Needling was performed over or adjacent to a lung field, pt was educated on S/S of pneumothorax and to seek immediate medical attention should they occur.  Patient was educated on signs and symptoms of infection and other risk factors and advised to seek medical attention should they occur.  Patient verbalized understanding of these instructions and education.   Patient Verbal Consent Given: Yes Education Handout Provided: Yes Muscles Treated: lumbar paraspinals Electrical Stimulation Performed: No Treatment Response/Outcome: decrease in tissue tension, decrease in symptoms    Treatment                            4/30 - TENS with bracketing of lower lumbar and glutes x 10  - Attempted to lay supine and do LTR, and seated piriformis stretch with no success.  - Discussed next steps with PT.   Treatment                            4/25: Blank lines following charge title = not provided on this treatment date.   Manual:  TPDN No STM Lt Glut med PA spring mob Rt upper sacral quadrant There-ex: Supine piriformis stretch There-Act: Single layer heel lift in left shoe Self Care:  Nuro-Re-ed:  Gait Training:     PATIENT EDUCATION:  Education details: Anatomy of condition, POC, HEP, exercise form/rationale 12/01/23: DN, stretches  Person educated: Patient Education method: Explanation, Demonstration, Tactile cues, and Verbal cues Education comprehension: verbalized understanding, returned demonstration, verbal cues required, tactile cues required, and needs further education  HOME EXERCISE PROGRAM: Wear heel lift, tennis ball STM to right hip, supine Rt figure 4   ASSESSMENT:  CLINICAL IMPRESSION: PN: Pt has made slow but steady progress since onset of therapy.  She reports continuing to wear her heel lift at all times but is uncertain if it has helped. She has been seen intermittently over the past 8 weeks. She had not been tolerating land based intervention and was  switched to aquatics where she is able to participate to a higher degree.  Today she tolerates muscle testing (weakness found) and demonstrates normal lumbar ROM.  She continues to have high pain sensitivity with palpation at left SIJ lbut does demonstrate slightly improved mobility.  Overall pain has improved.  She will continue to benefit from skilled physical therapy.  Plan to add land based visits back into program to address SIJ mobility.  REHAB POTENTIAL: Fair chronic nature and multiple other treatments  CLINICAL DECISION MAKING: Stable/uncomplicated  EVALUATION COMPLEXITY: Low   GOALS: Goals reviewed with patient? Yes  SHORT TERM GOALS: Target date: 5/10  Determine need for heel lift Baseline: began wearing at eval Goal status:Met 01/03/24  2.  Demo proper hip hinge  Baseline: compensations noted at eval upon standing from chair Goal status: Met 01/03/24   LONG TERM GOALS: Target date: 03/02/24  Able to sit for at least 45 min with minimal discomfort Baseline:  severe at eval Goal status: Met 01/03/24  2.  Oswestry to improve by MDC Baseline: see obj Goal status: In progress 01/03/24  3.  Independent with core stability program  Baseline: not established Goal status: INITIAL  4.  Bil LE strength within 10% of opposite LE Baseline: not appropriate to test at eval due to pain levels; HD tested today Goal status: In progress 01/03/24     PLAN:  PT FREQUENCY: 1-2x/week  PT DURATION:   PLANNED INTERVENTIONS: 97164- PT Re-evaluation, 97750- Physical Performance Testing, 97110-Therapeutic exercises, 97530- Therapeutic activity, [id number]- Neuromuscular re-education, 97535- Self Care, [id number]- Manual therapy, [phone]- Gait training, [phone]- Aquatic Therapy, Patient/Family education, Balance training, Stair training, Taping, Dry Needling, Joint mobilization, Spinal mobilization, Cryotherapy, and Moist heat.  PLAN FOR NEXT SESSION: continue STM around hip, core strength and  spinal mobility, possibly DN  Ronal Kem) Hadja Harral MPT 01/03/24 6:20 PM [facility] [address] [place], [place], [address] Phone: [phone]   Fax:  [phone]

## 2024-01-05 ENCOUNTER — Encounter (HOSPITAL_BASED_OUTPATIENT_CLINIC_OR_DEPARTMENT_OTHER): Payer: Self-pay

## 2024-01-05 ENCOUNTER — Ambulatory Visit (HOSPITAL_BASED_OUTPATIENT_CLINIC_OR_DEPARTMENT_OTHER): Admitting: Physical Therapy

## 2024-01-10 ENCOUNTER — Encounter (HOSPITAL_BASED_OUTPATIENT_CLINIC_OR_DEPARTMENT_OTHER): Payer: Self-pay | Admitting: Physical Therapy

## 2024-01-10 ENCOUNTER — Ambulatory Visit (HOSPITAL_BASED_OUTPATIENT_CLINIC_OR_DEPARTMENT_OTHER): Attending: Nurse Practitioner | Admitting: Physical Therapy

## 2024-01-10 DIAGNOSIS — M5459 Other low back pain: Secondary | ICD-10-CM | POA: Diagnosis present

## 2024-01-10 DIAGNOSIS — M79605 Pain in left leg: Secondary | ICD-10-CM | POA: Diagnosis present

## 2024-01-10 NOTE — Therapy (Signed)
 OUTPATIENT PHYSICAL THERAPY TREATMENT      Patient Name: Christine Mccormick MRN: [id number] DOB:26-Nov-1980, 43 y.o., female Today's Date: 01/10/2024  END OF SESSION:  PT End of Session - 01/10/24 1621     Visit Number 9    Number of Visits 17    Date for PT Re-Evaluation 03/02/24    Authorization Type [place] [employer]    Authorization - Number of Visits 27    PT Start Time 1616    PT Stop Time 1700    PT Time Calculation (min) 44 min    Activity Tolerance Patient tolerated treatment well    Behavior During Therapy WFL for tasks assessed/performed            Past Medical History:  Diagnosis Date   Anemia    Blood transfusion without reported diagnosis    Colitis    Concussion    x2   Diverticulosis    Glaucoma    Heart murmur    Hypertension    Hypothyroidism    patient reported   Past Surgical History:  Procedure Laterality Date   abalation     COLONOSCOPY     CYST EXCISION     left wrist   DILATATION & CURETTAGE/HYSTEROSCOPY WITH MYOSURE     HYSTEROSCOPY     ROTATOR CUFF REPAIR     left shoulder   TUBAL LIGATION     There are no active problems to display for this patient.   PCP: Leita Jama Norse FNP  REFERRING PROVIDER: Daris Merlynn BRAVO, NP   REFERRING DIAG:  M53.3 (ICD-10-CM) - Sacrococcygeal disorders, not elsewhere classified  M25.552 (ICD-10-CM) - Pain in left hip    Rationale for Evaluation and Treatment: Rehabilitation  THERAPY DIAG:  Other low back pain  Pain in left leg  ONSET DATE: years ago   SUBJECTIVE:                                                                                                                                                                                           SUBJECTIVE STATEMENT: Pt cancelled last session due to spike in pain after week spent with dtr at hospital.  She reports pain has reduced back to her normal 2/10 after spend the last few days in bed.    Eval: Had injections in  hip without relief at PCP so went to [facility] spine specialists. Had injection with imaging and felt everything I was not supposed to feel. Did it differently and felt a little better, last time did not help (about 2 mo ago).  It feels like a stabbing pain in Lt SIJ, hip  and down leg. It used to be on both sides but now just the left leg and both sides of lower back.  Muscle relaxers make me drowsy, pain meds help because I can take a small dose during the day.  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 Sitting is painful, sometimes standing/walking helps a little.  Did PT a couple years ago and it was bad. Went to chiropractor but it was expensive and only helped for short term. Will never do TPDN again, has not tried massage.     PERTINENT HISTORY:  Chronic nature  PAIN:  Are you having pain? Yes: NPRS scale:  (normal pain) 2/10 Pain location: Lt hip Pain description: stabbing Aggravating factors: sitting Relieving factors: walking but only a little  PRECAUTIONS:  None  RED FLAGS: None   WEIGHT BEARING RESTRICTIONS:  No  FALLS:  Has patient fallen in last 6 months? 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  DIAGNOSTIC FINDINGS:  CT in ED on 4/1: CT does not show any acute emergent condition. There is questionable diverticulitis but she has no abdominal pain or symptoms consistent with this   PATIENT SURVEYS:  Modified Oswestry 20   POSTURE:  EVAL: Rt leg bowing at rest  GAIT: EVAL: demo gait WFL, good cadence   Body Part #1 Lumbar  PALPATION: EVAL: Rt SIJ limited mobility, spasm noted in musculature surrounding Lt hip- reported concordant pain upon palpation  01/03/24  LUMBAR ROM:  WFL  HD muscle testing Right hip flex 24.0  left 23.0 Right hip abd 15.4  left 9.1                                                                                                                             TREATMENT DATE:  [facility] Adult PT Treatment:                                            01/10/24  Pt seen for aquatic therapy today.  Treatment took place in water 3.5-4.75 ft in depth at the [facility] pool. Temp of water was 91.  Pt entered/exited the pool via stairs independently with bilat rail.  - unsupported walking forward/ backward  - side stepping R/L 4.7ft ue add/abd unsupported - suitcase carry walking forward backward with RBHB bilat then unilateral  - TrA set with 1/2 hollow pull down to thighs in wide and staggered stance x 10 each -seated swing like on solid noodle PPT; hip hiking; rotation.  VC and demonstration for execution -returned to walking for recovery - straddling noodle cycling across pool ue support yellow HB x 4 laps; with UE support on corner: hip abdct/ addct x15-20;  hip flex/ext x15-20  Pt requires the buoyancy and hydrostatic pressure of water for support, and to offload joints by unweighting joint load by at least 50 % in navel deep water and by at least 75-80% in chest to neck deep water.  Viscosity of the water is needed for resistance of strengthening. Water current perturbations provides challenge to standing balance requiring increased core activation.    Prior land session 12/01/23 DKTC with heels on green ball 10 x 5 second holds Supine hip abduction isometric with belt 10 x 5-10 second holds Manual: STM to lumbar paraspinals pre and post dry needling for trigger point identification and muscular relaxation. Trigger Point Dry Needling  Initial Treatment: Pt instructed on Dry Needling rational, procedures, and possible side effects. Pt instructed to expect mild to moderate muscle soreness later in the day and/or into the next day.  Pt instructed in methods to reduce muscle soreness. Pt instructed to continue prescribed HEP. Because Dry Needling was performed over or  adjacent to a lung field, pt was educated on S/S of pneumothorax and to seek immediate medical attention should they occur.  Patient was educated on signs and symptoms of infection and other risk factors and advised to seek medical attention should they occur.  Patient verbalized understanding of these instructions and education.   Patient Verbal Consent Given: Yes Education Handout Provided: Yes Muscles Treated: lumbar paraspinals Electrical Stimulation Performed: No Treatment Response/Outcome: decrease in tissue tension, decrease in symptoms    Treatment                            4/30 - TENS with bracketing of lower lumbar and glutes x 10  - Attempted to lay supine and do LTR, and seated piriformis stretch with no success.  - Discussed next steps with PT.   Treatment                            4/25: Blank lines following charge title = not provided on this treatment date.   Manual:  TPDN No STM Lt Glut med PA spring mob Rt upper sacral quadrant There-ex: Supine piriformis stretch There-Act: Single layer heel lift in left shoe Self Care:  Nuro-Re-ed:  Gait Training:     PATIENT EDUCATION:  Education details: Anatomy of condition, POC, HEP, exercise form/rationale 12/01/23: DN, stretches  Person educated: Patient Education method: Explanation, Demonstration, Tactile cues, and Verbal cues Education comprehension: verbalized understanding, returned demonstration, verbal cues required, tactile cues required, and needs further education  HOME EXERCISE PROGRAM: Wear heel lift, tennis ball STM to right hip, supine Rt figure 4   ASSESSMENT:  CLINICAL IMPRESSION: Pt reports improvement in pain since arriving for last session and being unable to participate. Her posture as well as movements are not guarded, she appears rested.  Good toleration to session with minor complaints of right hip discomfort.  Added PPT and hip hiking for gentle mobilization.  Vc and demonstration  provided for execution.  No change in pain sensitivity throughout session.   PN: Pt has made slow but steady progress since onset of therapy.  She reports continuing to wear her heel lift at all times but is uncertain if it has helped. She has been seen intermittently over the past 8 weeks. She had not been tolerating land based intervention and was switched to aquatics where she is able to participate to a higher degree.  Today  she tolerates muscle testing (weakness found) and demonstrates normal lumbar ROM.  She continues to have high pain sensitivity with palpation at left SIJ lbut does demonstrate slightly improved mobility.  Overall pain has improved.  She will continue to benefit from skilled physical therapy.  Plan to add land based visits back into program to address SIJ mobility.     REHAB POTENTIAL: Fair chronic nature and multiple other treatments  CLINICAL DECISION MAKING: Stable/uncomplicated  EVALUATION COMPLEXITY: Low   GOALS: Goals reviewed with patient? Yes  SHORT TERM GOALS: Target date: 5/10  Determine need for heel lift Baseline: began wearing at eval Goal status:Met 01/03/24  2.  Demo proper hip hinge  Baseline: compensations noted at eval upon standing from chair Goal status: Met 01/03/24   LONG TERM GOALS: Target date: 03/02/24  Able to sit for at least 45 min with minimal discomfort Baseline:  severe at eval Goal status: Met 01/03/24  2.  Oswestry to improve by MDC Baseline: see obj Goal status: In progress 01/03/24  3.  Independent with core stability program  Baseline: not established Goal status: INITIAL  4.  Bil LE strength within 10% of opposite LE Baseline: not appropriate to test at eval due to pain levels; HD tested today Goal status: In progress 01/03/24     PLAN:  PT FREQUENCY: 1-2x/week  PT DURATION: 4 weeks  PLANNED INTERVENTIONS: 97164- PT Re-evaluation, 97750- Physical Performance Testing, 97110-Therapeutic exercises, 97530-  Therapeutic activity, 97112- Neuromuscular re-education, 97535- Self Care, 02859- Manual therapy, [phone]- Gait training, [phone]- Aquatic Therapy, Patient/Family education, Balance training, Stair training, Taping, Dry Needling, Joint mobilization, Spinal mobilization, Cryotherapy, and Moist heat.  PLAN FOR NEXT SESSION: continue STM around hip, core strength and spinal mobility, possibly DN  Ronal Foots) Loralie Malta MPT 01/10/24 4:23 PM [facility] [address] [place], [place], [address] Phone: [phone]   Fax:  [phone]

## 2024-01-17 ENCOUNTER — Encounter (HOSPITAL_BASED_OUTPATIENT_CLINIC_OR_DEPARTMENT_OTHER): Payer: Self-pay | Admitting: Physical Therapy

## 2024-01-17 ENCOUNTER — Ambulatory Visit (HOSPITAL_BASED_OUTPATIENT_CLINIC_OR_DEPARTMENT_OTHER): Admitting: Physical Therapy

## 2024-01-17 DIAGNOSIS — M5459 Other low back pain: Secondary | ICD-10-CM

## 2024-01-17 DIAGNOSIS — M79605 Pain in left leg: Secondary | ICD-10-CM

## 2024-01-17 NOTE — Therapy (Signed)
 OUTPATIENT PHYSICAL THERAPY TREATMENT      Patient Name: Christine Mccormick MRN: [id number] DOB:1980-07-29, 43 y.o., female Today's Date: 01/17/2024  END OF SESSION:  PT End of Session - 01/17/24 1449     Visit Number 10    Number of Visits 17    Date for PT Re-Evaluation 03/02/24    Authorization Type [place] [employer]    Authorization - Visit Number 10    Authorization - Number of Visits 27    PT Start Time 1448    PT Stop Time 1530    PT Time Calculation (min) 42 min    Activity Tolerance Patient tolerated treatment well    Behavior During Therapy WFL for tasks assessed/performed            Past Medical History:  Diagnosis Date   Anemia    Blood transfusion without reported diagnosis    Colitis    Concussion    x2   Diverticulosis    Glaucoma    Heart murmur    Hypertension    Hypothyroidism    patient reported   Past Surgical History:  Procedure Laterality Date   abalation     COLONOSCOPY     CYST EXCISION     left wrist   DILATATION & CURETTAGE/HYSTEROSCOPY WITH MYOSURE     HYSTEROSCOPY     ROTATOR CUFF REPAIR     left shoulder   TUBAL LIGATION     There are no active problems to display for this patient.   PCP: Christine Jama Norse FNP  REFERRING PROVIDER: Daris Merlynn BRAVO, NP   REFERRING DIAG:  M53.3 (ICD-10-CM) - Sacrococcygeal disorders, not elsewhere classified  M25.552 (ICD-10-CM) - Pain in left hip    Rationale for Evaluation and Treatment: Rehabilitation  THERAPY DIAG:  Other low back pain  Pain in left leg  ONSET DATE: years ago   SUBJECTIVE:                                                                                                                                                                                           SUBJECTIVE STATEMENT: Doing well.  No pain. Stayed in th bed for 24 hours    Eval: Had injections in hip without relief at PCP so went to [facility] spine specialists. Had injection with  imaging and felt everything I was not supposed to feel. Did it differently and felt a little better, last time did not help (about 2 mo ago).  It feels like a stabbing pain in Lt SIJ, hip and down leg. It used to be on both sides but now just the left  leg and both sides of lower back.  Muscle relaxers make me drowsy, pain meds help because I can take a small dose during the day.  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 Sitting is painful, sometimes standing/walking helps a little.  Did PT a couple years ago and it was bad. Went to chiropractor but it was expensive and only helped for short term. Will never do TPDN again, has not tried massage.     PERTINENT HISTORY:  Chronic nature  PAIN:  Are you having pain? Yes: NPRS scale:  (normal pain) 0/10 Pain location: Lt hip Pain description: stabbing Aggravating factors: sitting Relieving factors: walking but only a little  PRECAUTIONS:  None  RED FLAGS: None   WEIGHT BEARING RESTRICTIONS:  No  FALLS:  Has patient fallen in last 6 months? 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  DIAGNOSTIC FINDINGS:  CT in ED on 4/1: CT does not show any acute emergent condition. There is questionable diverticulitis but she has no abdominal pain or symptoms consistent with this   PATIENT SURVEYS:  Modified Oswestry 20   POSTURE:  EVAL: Rt leg bowing at rest  GAIT: EVAL: demo gait WFL, good cadence   Body Part #1 Lumbar  PALPATION: EVAL: Rt SIJ limited mobility, spasm noted in musculature surrounding Lt hip- reported concordant pain upon palpation  01/03/24  LUMBAR ROM:  WFL  HD muscle testing Right hip flex 24.0  left 23.0 Right hip abd 15.4  left 9.1                                                                                                                             TREATMENT DATE:   [facility] Adult PT Treatment:                                            01/16/24  Pt seen for aquatic therapy today.  Treatment took place in water 3.5-4.75 ft in depth at the [facility] pool. Temp of water was 91.  Pt entered/exited the pool via stairs independently with bilat rail.  - unsupported walking forward/ backward  - side stepping R/L 4.23ft ue add/abd unsupported-> 3.9 ft with RBHB shoulder add/abd - suitcase carry walking forward backward with RBHB bilat then unilateral  -seated swing like on solid noodle PPT; hip hiking; rotation.  VC and demonstration for execution - TrA set with 1/2 ->full hollow pull down to thighs in wide and staggered stance x 5 each. Some Lb discomfort -returned to walking for recovery - straddling noodle cycling across pool ue support yellow HB x 4 laps  Pt requires the buoyancy and hydrostatic pressure of water for support, and  to offload joints by unweighting joint load by at least 50 % in navel deep water and by at least 75-80% in chest to neck deep water.  Viscosity of the water is needed for resistance of strengthening. Water current perturbations provides challenge to standing balance requiring increased core activation.   [facility] Adult PT Treatment:                                            01/10/24  Pt seen for aquatic therapy today.  Treatment took place in water 3.5-4.75 ft in depth at the [facility] pool. Temp of water was 91.  Pt entered/exited the pool via stairs independently with bilat rail.  - unsupported walking forward/ backward  - side stepping R/L 4.48ft ue add/abd unsupported - suitcase carry walking forward backward with RBHB bilat then unilateral  - TrA set with 1/2 hollow pull down to thighs in wide and staggered stance x 10 each -seated swing like on solid noodle PPT; hip hiking; rotation.  VC and demonstration for execution -returned to walking for recovery - straddling noodle cycling across pool  ue support yellow HB x 4 laps; with UE support on corner: hip abdct/ addct x15-20; hip flex/ext x15-20  Pt requires the buoyancy and hydrostatic pressure of water for support, and to offload joints by unweighting joint load by at least 50 % in navel deep water and by at least 75-80% in chest to neck deep water.  Viscosity of the water is needed for resistance of strengthening. Water current perturbations provides challenge to standing balance requiring increased core activation.    Prior land session 12/01/23 DKTC with heels on green ball 10 x 5 second holds Supine hip abduction isometric with belt 10 x 5-10 second holds Manual: STM to lumbar paraspinals pre and post dry needling for trigger point identification and muscular relaxation. Trigger Point Dry Needling  Initial Treatment: Pt instructed on Dry Needling rational, procedures, and possible side effects. Pt instructed to expect mild to moderate muscle soreness later in the day and/or into the next day.  Pt instructed in methods to reduce muscle soreness. Pt instructed to continue prescribed HEP. Because Dry Needling was performed over or adjacent to a lung field, pt was educated on S/S of pneumothorax and to seek immediate medical attention should they occur.  Patient was educated on signs and symptoms of infection and other risk factors and advised to seek medical attention should they occur.  Patient verbalized understanding of these instructions and education.   Patient Verbal Consent Given: Yes Education Handout Provided: Yes Muscles Treated: lumbar paraspinals Electrical Stimulation Performed: No Treatment Response/Outcome: decrease in tissue tension, decrease in symptoms    Treatment                            4/30 - TENS with bracketing of lower lumbar and glutes x 10  - Attempted to lay supine and do LTR, and seated piriformis stretch with no success.  - Discussed next steps with PT.   Treatment                             4/25: Blank lines following charge title = not provided on this treatment date.   Manual:  TPDN No STM Lt Glut med PA spring  mob Rt upper sacral quadrant There-ex: Supine piriformis stretch There-Act: Single layer heel lift in left shoe Self Care:  Nuro-Re-ed:  Gait Training:     PATIENT EDUCATION:  Education details: Anatomy of condition, POC, HEP, exercise form/rationale 12/01/23: DN, stretches  Person educated: Patient Education method: Explanation, Demonstration, Tactile cues, and Verbal cues Education comprehension: verbalized understanding, returned demonstration, verbal cues required, tactile cues required, and needs further education  HOME EXERCISE PROGRAM: Wear heel lift, tennis ball STM to right hip, supine Rt figure 4   ASSESSMENT:  CLINICAL IMPRESSION: Pt presents today without pain at initiation of session. 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  This demonstrates improvement in toleration to activity as well as reduction in SIJ pain. Will plan to see pt 1 more time this week then cancel the following weeks appts as she will be on vacation.  Will re-assess condition when she returns to determine progression of treatment.     PN: Pt has made slow but steady progress since onset of therapy.  She reports continuing to wear her heel lift at all times but is uncertain if it has helped. She has been seen intermittently over the past 8 weeks. She had not been tolerating land based intervention and was switched to aquatics where she is able to participate to a higher degree.  Today she tolerates muscle testing (weakness found) and demonstrates normal lumbar ROM.  She continues to have high pain sensitivity with palpation at left SIJ lbut does demonstrate slightly improved mobility.  Overall pain has improved.  She will continue to  benefit from skilled physical therapy.  Plan to add land based visits back into program to address SIJ mobility.     REHAB POTENTIAL: Fair chronic nature and multiple other treatments  CLINICAL DECISION MAKING: Stable/uncomplicated  EVALUATION COMPLEXITY: Low   GOALS: Goals reviewed with patient? Yes  SHORT TERM GOALS: Target date: 5/10  Determine need for heel lift Baseline: began wearing at eval Goal status:Met 01/03/24  2.  Demo proper hip hinge  Baseline: compensations noted at eval upon standing from chair Goal status: Met 01/03/24   LONG TERM GOALS: Target date: 03/02/24  Able to sit for at least 45 min with minimal discomfort Baseline:  severe at eval Goal status: Met 01/03/24  2.  Oswestry to improve by MDC Baseline: see obj Goal status: In progress 01/03/24  3.  Independent with core stability program  Baseline: not established Goal status: INITIAL  4.  Bil LE strength within 10% of opposite LE Baseline: not appropriate to test at eval due to pain levels; HD tested today Goal status: In progress 01/03/24     PLAN:  PT FREQUENCY: 1-2x/week  PT DURATION: 4 weeks  PLANNED INTERVENTIONS: 97164- PT Re-evaluation, 97750- Physical Performance Testing, 97110-Therapeutic exercises, 97530- Therapeutic activity, 97112- Neuromuscular re-education, 97535- Self Care, 02859- Manual therapy, [phone]- Gait training, [phone]- Aquatic Therapy, Patient/Family education, Balance training, Stair training, Taping, Dry Needling, Joint mobilization, Spinal mobilization, Cryotherapy, and Moist heat.  PLAN FOR NEXT SESSION: continue STM around hip, core strength and spinal mobility, possibly DN  Ronal Kem) Navaya Wiatrek MPT 01/17/24 2:51 PM [facility] [address] [place], [place], [address] Phone: [phone]   Fax:  [phone]

## 2024-01-20 ENCOUNTER — Encounter (HOSPITAL_BASED_OUTPATIENT_CLINIC_OR_DEPARTMENT_OTHER): Payer: Self-pay | Admitting: Physical Therapy

## 2024-01-20 ENCOUNTER — Ambulatory Visit (HOSPITAL_BASED_OUTPATIENT_CLINIC_OR_DEPARTMENT_OTHER): Admitting: Physical Therapy

## 2024-01-20 DIAGNOSIS — M5459 Other low back pain: Secondary | ICD-10-CM | POA: Diagnosis not present

## 2024-01-20 DIAGNOSIS — M79605 Pain in left leg: Secondary | ICD-10-CM

## 2024-01-20 NOTE — Therapy (Signed)
 OUTPATIENT PHYSICAL THERAPY TREATMENT      Patient Name: Christine Mccormick MRN: [id number] DOB:[birth year], 43 y.o., female Today's Date: 01/20/2024  END OF SESSION:  PT End of Session - 01/20/24 1031     Visit Number 11    Number of Visits 17    Date for PT Re-Evaluation 03/02/24    Authorization Type [employer]    Authorization - Visit Number 11    Authorization - Number of Visits 27    PT Start Time 1021   pt arrived late due to traffic   PT Stop Time 1059    PT Time Calculation (min) 38 min    Behavior During Therapy WFL for tasks assessed/performed            Past Medical History:  Diagnosis Date   Anemia    Blood transfusion without reported diagnosis    Colitis    Concussion    x2   Diverticulosis    Glaucoma    Heart murmur    Hypertension    Hypothyroidism    patient reported   Past Surgical History:  Procedure Laterality Date   abalation     COLONOSCOPY     CYST EXCISION     left wrist   DILATATION & CURETTAGE/HYSTEROSCOPY WITH MYOSURE     HYSTEROSCOPY     ROTATOR CUFF REPAIR     left shoulder   TUBAL LIGATION     There are no active problems to display for this patient.   PCP: Leita Jama Norse FNP  REFERRING PROVIDER: Daris Merlynn BRAVO, NP   REFERRING DIAG:  M53.3 (ICD-10-CM) - Sacrococcygeal disorders, not elsewhere classified  M25.552 (ICD-10-CM) - Pain in left hip    Rationale for Evaluation and Treatment: Rehabilitation  THERAPY DIAG:  Other low back pain  Pain in left leg  ONSET DATE: years ago   SUBJECTIVE:                                                                                                                                                                                           SUBJECTIVE I'm tired.   Pt reports her pool has been closed this week; had to drain it and clean it.   Prior to this she had been using it to do walking and suspended cycling. She states she is not currently working  [place], until middle of August.   POOL ACCESS: has hot tub and pool at apartment complex.    Eval: Had injections in hip without relief at PCP so went to [facility] spine specialists. Had injection with imaging and felt everything  I was not supposed to feel. Did it differently and felt a little better, last time did not help (about 2 mo ago).  It feels like a stabbing pain in Lt SIJ, hip and down leg. It used to be on both sides but now just the left leg and both sides of lower back.  Muscle relaxers make me drowsy, pain meds help because I can take a small dose during the day.  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 Sitting is painful, sometimes standing/walking helps a little.  Did PT a couple years ago and it was bad. Went to chiropractor but it was expensive and only helped for short term. Will never do TPDN again, has not tried massage.     PERTINENT HISTORY:  Chronic nature  PAIN:  Are you having pain? Yes: NPRS scale:  (normal pain) 2/10 Pain location: Lt hip and lower back Pain description: stabbing Aggravating factors: sitting Relieving factors: walking but only a little  PRECAUTIONS:  None  RED FLAGS: None   WEIGHT BEARING RESTRICTIONS:  No  FALLS:  Has patient fallen in last 6 months? 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  DIAGNOSTIC FINDINGS:  CT in ED on 4/1: CT does not show any acute emergent condition. There is questionable diverticulitis but she has no abdominal pain or symptoms consistent with this   PATIENT SURVEYS:  Modified Oswestry 20   POSTURE:  EVAL: Rt leg bowing at rest  GAIT: EVAL: demo gait WFL, good cadence   Body Part #1 Lumbar  PALPATION: EVAL: Rt SIJ limited mobility, spasm noted in musculature surrounding Lt hip- reported concordant pain upon  palpation  01/03/24  LUMBAR ROM:  WFL  HD muscle testing Right hip flex 24.0  left 23.0 Right hip abd 15.4  left 9.1                                                                                                                            TREATMENT DATE:  [facility] Adult PT Treatment:                                            01/20/24  Pt seen for aquatic therapy today.  Treatment took place in water 3.5-4.75 ft in depth at the [facility] pool. Temp of water was 91.  Pt entered/exited the pool via stairs independently with bilat rail.  - unsupported walking forward/ backward  - side stepping R/L 4.19ft ue add/abdct with Rainbow hand floats -> short hollow noodles - short hollow noodle pull down to front of thighs (only painful with RUE, not LUE) - trial of tap to opp knee  - suitcase carry walking forward /  backward with rainbow hand floats,  bilat then unilateral  - straddling noodle, cycling across pool with UE support rainbow hand floats x 4 laps; suspended jumping jack LEs and cross country ski x 15 each; cycling  Pt requires the buoyancy and hydrostatic pressure of water for support, and to offload joints by unweighting joint load by at least 50 % in navel deep water and by at least 75-80% in chest to neck deep water.  Viscosity of the water is needed for resistance of strengthening. Water current perturbations provides challenge to standing balance requiring increased core activation. [facility] Adult PT Treatment:                                            01/16/24  Pt seen for aquatic therapy today.  Treatment took place in water 3.5-4.75 ft in depth at the [facility] pool. Temp of water was 91.  Pt entered/exited the pool via stairs independently with bilat rail.  - unsupported walking forward/ backward  - side stepping R/L 4.82ft ue add/abd unsupported-> 3.9 ft with RBHB shoulder add/abd - suitcase carry walking forward backward with RBHB bilat then unilateral  -seated  swing like on solid noodle PPT; hip hiking; rotation.  VC and demonstration for execution - TrA set with 1/2 ->full hollow pull down to thighs in wide and staggered stance x 5 each. Some Lb discomfort -returned to walking for recovery - straddling noodle cycling across pool ue support yellow HB x 4 laps  Pt requires the buoyancy and hydrostatic pressure of water for support, and to offload joints by unweighting joint load by at least 50 % in navel deep water and by at least 75-80% in chest to neck deep water.  Viscosity of the water is needed for resistance of strengthening. Water current perturbations provides challenge to standing balance requiring increased core activation.   [facility] Adult PT Treatment:                                            01/10/24  Pt seen for aquatic therapy today.  Treatment took place in water 3.5-4.75 ft in depth at the [facility] pool. Temp of water was 91.  Pt entered/exited the pool via stairs independently with bilat rail.  - unsupported walking forward/ backward  - side stepping R/L 4.45ft ue add/abd unsupported - suitcase carry walking forward backward with RBHB bilat then unilateral  - TrA set with 1/2 hollow pull down to thighs in wide and staggered stance x 10 each -seated swing like on solid noodle PPT; hip hiking; rotation.  VC and demonstration for execution -returned to walking for recovery - straddling noodle cycling across pool ue support yellow HB x 4 laps; with UE support on corner: hip abdct/ addct x15-20; hip flex/ext x15-20  Pt requires the buoyancy and hydrostatic pressure of water for support, and to offload joints by unweighting joint load by at least 50 % in navel deep water and by at least 75-80% in chest to neck deep water.  Viscosity of the water is needed for resistance of strengthening. Water current perturbations provides challenge to standing balance requiring increased core activation.    Prior land session 12/01/23 DKTC with  heels on green ball 10 x 5 second holds  Supine hip abduction isometric with belt 10 x 5-10 second holds Manual: STM to lumbar paraspinals pre and post dry needling for trigger point identification and muscular relaxation. Trigger Point Dry Needling  Initial Treatment: Pt instructed on Dry Needling rational, procedures, and possible side effects. Pt instructed to expect mild to moderate muscle soreness later in the day and/or into the next day.  Pt instructed in methods to reduce muscle soreness. Pt instructed to continue prescribed HEP. Because Dry Needling was performed over or adjacent to a lung field, pt was educated on S/S of pneumothorax and to seek immediate medical attention should they occur.  Patient was educated on signs and symptoms of infection and other risk factors and advised to seek medical attention should they occur.  Patient verbalized understanding of these instructions and education.   Patient Verbal Consent Given: Yes Education Handout Provided: Yes Muscles Treated: lumbar paraspinals Electrical Stimulation Performed: No Treatment Response/Outcome: decrease in tissue tension, decrease in symptoms    Treatment                            4/30 - TENS with bracketing of lower lumbar and glutes x 10  - Attempted to lay supine and do LTR, and seated piriformis stretch with no success.  - Discussed next steps with PT.   Treatment                            4/25: Blank lines following charge title = not provided on this treatment date.   Manual:  TPDN No STM Lt Glut med PA spring mob Rt upper sacral quadrant There-ex: Supine piriformis stretch There-Act: Single layer heel lift in left shoe Self Care:  Nuro-Re-ed:  Gait Training:     PATIENT EDUCATION:  Education details: Anatomy of condition, POC, HEP, exercise form/rationale 12/01/23: DN, stretches  Person educated: Patient Education method: Explanation, Demonstration, Tactile cues, and Verbal  cues Education comprehension: verbalized understanding, returned demonstration, verbal cues required, tactile cues required, and needs further education  HOME EXERCISE PROGRAM: Wear heel lift, tennis ball STM to right hip, supine Rt figure 4   ASSESSMENT:  CLINICAL IMPRESSION: Pt reported increased discomfort in lower back with hollow noodle pull down to front of thighs; feels relief with hip hinge forward arm reach.  All other exercises tolerated well. She reported eliminated when suspended on noodle cycling. Pt will be on vacation next week.  Will re-assess condition when she returns to determine progression of treatment.  Will begin creating HEP for her to use at local pools or when on vacation.      PN: Pt has made slow but steady progress since onset of therapy.  She reports continuing to wear her heel lift at all times but is uncertain if it has helped. She has been seen intermittently over the past 8 weeks. She had not been tolerating land based intervention and was switched to aquatics where she is able to participate to a higher degree.  Today she tolerates muscle testing (weakness found) and demonstrates normal lumbar ROM.  She continues to have high pain sensitivity with palpation at left SIJ lbut does demonstrate slightly improved mobility.  Overall pain has improved.  She will continue to benefit from skilled physical therapy.  Plan to add land based visits back into program to address SIJ mobility.     REHAB POTENTIAL: Fair chronic nature and multiple other treatments  CLINICAL DECISION MAKING: Stable/uncomplicated  EVALUATION COMPLEXITY: Low   GOALS: Goals reviewed with patient? Yes  SHORT TERM GOALS: Target date: 5/10  Determine need for heel lift Baseline: began wearing at eval Goal status:Met 01/03/24  2.  Demo proper hip hinge  Baseline: compensations noted at eval upon standing from chair Goal status: Met 01/03/24   LONG TERM GOALS: Target date:  03/02/24  Able to sit for at least 45 min with minimal discomfort Baseline:  severe at eval Goal status: Met 01/03/24  2.  Oswestry to improve by MDC Baseline: see obj Goal status: In progress 01/03/24  3.  Independent with core stability program  Baseline: not established Goal status: INITIAL  4.  Bil LE strength within 10% of opposite LE Baseline: not appropriate to test at eval due to pain levels; HD tested today Goal status: In progress 01/03/24     PLAN:  PT FREQUENCY: 1-2x/week  PT DURATION: 4 weeks  PLANNED INTERVENTIONS: 97164- PT Re-evaluation, 97750- Physical Performance Testing, 97110-Therapeutic exercises, 97530- Therapeutic activity, 97112- Neuromuscular re-education, 97535- Self Care, [id number]- Manual therapy, [phone]- Gait training, [phone]- Aquatic Therapy, Patient/Family education, Balance training, Stair training, Taping, Dry Needling, Joint mobilization, Spinal mobilization, Cryotherapy, and Moist heat.  PLAN FOR NEXT SESSION: continue STM around hip, core strength and spinal mobility, possibly DN  Delon Aquas, PTA 01/20/24 10:57 AM [facility] [address] [place], [place], [address] Phone: [phone]   Fax:  [phone]

## 2024-01-23 ENCOUNTER — Ambulatory Visit (HOSPITAL_BASED_OUTPATIENT_CLINIC_OR_DEPARTMENT_OTHER): Admitting: Physical Therapy

## 2024-01-25 ENCOUNTER — Ambulatory Visit (HOSPITAL_BASED_OUTPATIENT_CLINIC_OR_DEPARTMENT_OTHER): Admitting: Physical Therapy

## 2024-01-30 ENCOUNTER — Ambulatory Visit (HOSPITAL_BASED_OUTPATIENT_CLINIC_OR_DEPARTMENT_OTHER): Admitting: Physical Therapy

## 2024-02-01 ENCOUNTER — Ambulatory Visit (HOSPITAL_BASED_OUTPATIENT_CLINIC_OR_DEPARTMENT_OTHER): Admitting: Physical Therapy

## 2024-02-01 ENCOUNTER — Encounter (HOSPITAL_BASED_OUTPATIENT_CLINIC_OR_DEPARTMENT_OTHER): Payer: Self-pay | Admitting: Physical Therapy

## 2024-02-01 DIAGNOSIS — M5459 Other low back pain: Secondary | ICD-10-CM | POA: Diagnosis not present

## 2024-02-01 DIAGNOSIS — M79605 Pain in left leg: Secondary | ICD-10-CM

## 2024-02-01 NOTE — Therapy (Addendum)
 OUTPATIENT PHYSICAL THERAPY TREATMENT  PHYSICAL THERAPY DISCHARGE SUMMARY  Visits from Start of Care: 12  Current functional level related to goals / functional outcomes: Indep with ad   Remaining deficits: Chronic dysfunction but pain free   Education / Equipment: Management of condition/HEP   Patient agrees to discharge. Patient goals were partially met. Patient is being discharged due to being pleased with the current functional level.  Addend Ronal Kem) Ziemba MPT 02/08/24 5:20 PM [facility] [address] [place], [place], [address] Phone: [phone]   Fax:  [phone]      Patient Name: Christine Mccormick MRN: [id number] DOB:12-06-1980, 43 y.o., female Today's Date: 02/01/2024  END OF SESSION:  PT End of Session - 02/01/24 1112     Visit Number 12    Number of Visits 17    Date for PT Re-Evaluation 03/02/24    Authorization Type [place] [employer]    Authorization - Number of Visits 27    PT Start Time 1103    PT Stop Time 1141    PT Time Calculation (min) 38 min    Behavior During Therapy WFL for tasks assessed/performed            Past Medical History:  Diagnosis Date   Anemia    Blood transfusion without reported diagnosis    Colitis    Concussion    x2   Diverticulosis    Glaucoma    Heart murmur    Hypertension    Hypothyroidism    patient reported   Past Surgical History:  Procedure Laterality Date   abalation     COLONOSCOPY     CYST EXCISION     left wrist   DILATATION & CURETTAGE/HYSTEROSCOPY WITH MYOSURE     HYSTEROSCOPY     ROTATOR CUFF REPAIR     left shoulder   TUBAL LIGATION     There are no active problems to display for this patient.   PCP: Leita Jama Norse FNP  REFERRING PROVIDER: Daris Merlynn BRAVO, NP   REFERRING DIAG:  M53.3 (ICD-10-CM) - Sacrococcygeal disorders, not elsewhere classified  M25.552 (ICD-10-CM) - Pain in left hip     Rationale for Evaluation and Treatment: Rehabilitation  THERAPY DIAG:  Other low back pain  Pain in left leg  ONSET DATE: years ago   SUBJECTIVE:                                                                                                                                                                                           SUBJECTIVE Pt reports she is painfree.  Haven't had a  bad day in a while.   She states she is not currently working her part-[place], until middle of August.   POOL ACCESS: has hot tub and pool at apartment complex.    Eval: Had injections in hip without relief at PCP so went to [facility] spine specialists. Had injection with imaging and felt everything I was not supposed to feel. Did it differently and felt a little better, last time did not help (about 2 mo ago).  It feels like a stabbing pain in Lt SIJ, hip and down leg. It used to be on both sides but now just the left leg and both sides of lower back.  Muscle relaxers make me drowsy, pain meds help because I can take a small dose during the day.  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 Sitting is painful, sometimes standing/walking helps a little.  Did PT a couple years ago and it was bad. Went to chiropractor but it was expensive and only helped for short term. Will never do TPDN again, has not tried massage.     PERTINENT HISTORY:  Chronic nature  PAIN:  Are you having pain? no: NPRS scale:  (0 Pain location:  Pain description:  Aggravating factors: prolonged sitting Relieving factors: walking but only a little  PRECAUTIONS:  None  RED FLAGS: None   WEIGHT BEARING RESTRICTIONS:  No  FALLS:  Has patient fallen in last 6 months? 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  DIAGNOSTIC FINDINGS:  CT in ED on 4/1: CT  does not show any acute emergent condition. There is questionable diverticulitis but she has no abdominal pain or symptoms consistent with this   PATIENT SURVEYS:  Modified Oswestry 20   POSTURE:  EVAL: Rt leg bowing at rest  GAIT: EVAL: demo gait WFL, good cadence   Body Part #1 Lumbar  PALPATION: EVAL: Rt SIJ limited mobility, spasm noted in musculature surrounding Lt hip- reported concordant pain upon palpation  01/03/24  LUMBAR ROM:  WFL  HD muscle testing Right hip flex 24.0  left 23.0 Right hip abd 15.4  left 9.1                                                                                                                            TREATMENT DATE:  [facility] Adult PT Treatment:                                            02/01/24  Pt seen for aquatic therapy today.  Treatment took place in water 3.5-4.75 ft in depth at the [facility] pool. Temp of water was 91.  Pt entered/exited the pool via stairs  independently with bilat rail.  - unsupported walking forward/ backward  - side stepping R/L 4.61ft ue add/abdct with Rainbow hand floats  - UE on yellow hand floats: leg swings into hip flexion/extension and hip abdct/add  - UE on yellow hand floats:  knee tucks -> alternating knee tucks (rotation in hips) -> side to side pendulum x 3 each -> front to back pendulum - suitcase carry walking forward / backward with yellow hand floats,  bilat then unilateral  - straddling noodle, cycling across pool with UE support rainbow hand floats x 4 laps; suspended jumping jack LEs and cross country ski x 15 each; cycling  [facility] Adult PT Treatment:                                            01/20/24  Pt seen for aquatic therapy today.  Treatment took place in water 3.5-4.75 ft in depth at the [facility] pool. Temp of water was 91.  Pt entered/exited the pool via stairs independently with bilat rail.  - unsupported walking forward/ backward  - side stepping R/L 4.32ft ue  add/abdct with Rainbow hand floats -> short hollow noodles - short hollow noodle pull down to front of thighs (only painful with RUE, not LUE) - trial of tap to opp knee  - suitcase carry walking forward / backward with rainbow hand floats,  bilat then unilateral  - straddling noodle, cycling across pool with UE support rainbow hand floats x 4 laps; suspended jumping jack LEs and cross country ski x 15 each; cycling  Pt requires the buoyancy and hydrostatic pressure of water for support, and to offload joints by unweighting joint load by at least 50 % in navel deep water and by at least 75-80% in chest to neck deep water.  Viscosity of the water is needed for resistance of strengthening. Water current perturbations provides challenge to standing balance requiring increased core activation. [facility] Adult PT Treatment:                                            01/16/24  Pt seen for aquatic therapy today.  Treatment took place in water 3.5-4.75 ft in depth at the [facility] pool. Temp of water was 91.  Pt entered/exited the pool via stairs independently with bilat rail.  - unsupported walking forward/ backward  - side stepping R/L 4.1ft ue add/abd unsupported-> 3.9 ft with RBHB shoulder add/abd - suitcase carry walking forward backward with RBHB bilat then unilateral  -seated swing like on solid noodle PPT; hip hiking; rotation.  VC and demonstration for execution - TrA set with 1/2 ->full hollow pull down to thighs in wide and staggered stance x 5 each. Some Lb discomfort -returned to walking for recovery - straddling noodle cycling across pool ue support yellow HB x 4 laps  Pt requires the buoyancy and hydrostatic pressure of water for support, and to offload joints by unweighting joint load by at least 50 % in navel deep water and by at least 75-80% in chest to neck deep water.  Viscosity of the water is needed for resistance of strengthening. Water current perturbations provides challenge  to standing balance requiring increased core activation.   [facility] Adult PT Treatment:  01/10/24  Pt seen for aquatic therapy today.  Treatment took place in water 3.5-4.75 ft in depth at the [facility] pool. Temp of water was 91.  Pt entered/exited the pool via stairs independently with bilat rail.  - unsupported walking forward/ backward  - side stepping R/L 4.33ft ue add/abd unsupported - suitcase carry walking forward backward with RBHB bilat then unilateral  - TrA set with 1/2 hollow pull down to thighs in wide and staggered stance x 10 each -seated swing like on solid noodle PPT; hip hiking; rotation.  VC and demonstration for execution -returned to walking for recovery - straddling noodle cycling across pool ue support yellow HB x 4 laps; with UE support on corner: hip abdct/ addct x15-20; hip flex/ext x15-20  Pt requires the buoyancy and hydrostatic pressure of water for support, and to offload joints by unweighting joint load by at least 50 % in navel deep water and by at least 75-80% in chest to neck deep water.  Viscosity of the water is needed for resistance of strengthening. Water current perturbations provides challenge to standing balance requiring increased core activation.    Prior land session 12/01/23 DKTC with heels on green ball 10 x 5 second holds Supine hip abduction isometric with belt 10 x 5-10 second holds Manual: STM to lumbar paraspinals pre and post dry needling for trigger point identification and muscular relaxation. Trigger Point Dry Needling  Initial Treatment: Pt instructed on Dry Needling rational, procedures, and possible side effects. Pt instructed to expect mild to moderate muscle soreness later in the day and/or into the next day.  Pt instructed in methods to reduce muscle soreness. Pt instructed to continue prescribed HEP. Because Dry Needling was performed over or adjacent to a lung field, pt was  educated on S/S of pneumothorax and to seek immediate medical attention should they occur.  Patient was educated on signs and symptoms of infection and other risk factors and advised to seek medical attention should they occur.  Patient verbalized understanding of these instructions and education.   Patient Verbal Consent Given: Yes Education Handout Provided: Yes Muscles Treated: lumbar paraspinals Electrical Stimulation Performed: No Treatment Response/Outcome: decrease in tissue tension, decrease in symptoms    Treatment                            4/30 - TENS with bracketing of lower lumbar and glutes x 10  - Attempted to lay supine and do LTR, and seated piriformis stretch with no success.  - Discussed next steps with PT.   Treatment                            4/25: Blank lines following charge title = not provided on this treatment date.   Manual:  TPDN No STM Lt Glut med PA spring mob Rt upper sacral quadrant There-ex: Supine piriformis stretch There-Act: Single layer heel lift in left shoe Self Care:  Nuro-Re-ed:  Gait Training:     PATIENT EDUCATION:  Education details: HEP  Person educated: Patient Education method: Programmer, multimedia, Facilities manager, Actor cues, and Verbal cues Education comprehension: verbalized understanding, returned demonstration, verbal cues required, tactile cues required, and needs further education  HOME EXERCISE PROGRAM:  AQUATIC Access Code: [email or web address] URL: [email or web address] Date: 02/01/2024 Prepared by: [facility]  Exercises - Forward March  - 3 x weekly - 3 sets - 10  reps - Side Stepping  - 3 x weekly - side to side pendulum swing with hand floats  - 3-5 x weekly - 1 sets - 5-10 reps - Forward Backward Pendulum Swings with Hip Abduction and Adduction  - 3-5 x weekly - 1 sets - 5-10 reps - Standing Hip Flexion Extension at [employer]  - 3 x weekly - 1-2 sets - 10 reps - Standing Hip  Abduction  - 3 x weekly - 2 sets - 10 reps - Abdominal Curls Diagonal with Upper Extremity Flotation  - 3 x weekly - 1-2 sets - 10 reps - Warrior III in [employer] with [employer]  - 3 x weekly - 10 reps - 3 Way Leg Stretch with Ankle on Pool Noodle  - 3 x weekly - 2-3 reps - 15-30 seconds  hold  ASSESSMENT:  CLINICAL IMPRESSION: Pt tolerated  session well without production of pain.  Pt verbalized readiness to d/c today. Pt is pleased with current level of function.  Pt was issued log in info for aquatic HEP through medbridge.  Pt has partially met her goals.      PN: Pt has made slow but steady progress since onset of therapy.  She reports continuing to wear her heel lift at all times but is uncertain if it has helped. She has been seen intermittently over the past 8 weeks. She had not been tolerating land based intervention and was switched to aquatics where she is able to participate to a higher degree.  Today she tolerates muscle testing (weakness found) and demonstrates normal lumbar ROM.  She continues to have high pain sensitivity with palpation at left SIJ lbut does demonstrate slightly improved mobility.  Overall pain has improved.  She will continue to benefit from skilled physical therapy.  Plan to add land based visits back into program to address SIJ mobility.     REHAB POTENTIAL: Fair chronic nature and multiple other treatments  CLINICAL DECISION MAKING: Stable/uncomplicated  EVALUATION COMPLEXITY: Low   GOALS: Goals reviewed with patient? Yes  SHORT TERM GOALS: Target date: 5/10  Determine need for heel lift Baseline: began wearing at eval Goal status:Met 01/03/24  2.  Demo proper hip hinge  Baseline: compensations noted at eval upon standing from chair Goal status: Met 01/03/24   LONG TERM GOALS: Target date: 03/02/24  Able to sit for at least 45 min with minimal discomfort Baseline:  severe at eval Goal status: Met 01/03/24  2.  Oswestry to improve by  MDC Baseline: see obj Goal status: In progress 01/03/24  3.  Independent with core stability program  Baseline: not established Goal status: INITIAL  4.  Bil LE strength within 10% of opposite LE Baseline: not appropriate to test at eval due to pain levels; HD tested today Goal status: In progress 01/03/24     PLAN:  PT FREQUENCY: 1-2x/week  PT DURATION: 4 weeks  PLANNED INTERVENTIONS: 97164- PT Re-evaluation, 97750- Physical Performance Testing, 97110-Therapeutic exercises, 97530- Therapeutic activity, 97112- Neuromuscular re-education, 97535- Self Care, 02859- Manual therapy, [phone]- Gait training, [phone]- Aquatic Therapy, Patient/Family education, Balance training, Stair training, Taping, Dry Needling, Joint mobilization, Spinal mobilization, Cryotherapy, and Moist heat.  Delon Aquas, PTA 02/01/24 12:50 PM [facility] [address] [place], [place], [address] Phone: [phone]   Fax:  [phone]

## 2024-02-06 ENCOUNTER — Ambulatory Visit (HOSPITAL_BASED_OUTPATIENT_CLINIC_OR_DEPARTMENT_OTHER): Admitting: Physical Therapy

## 2024-02-06 ENCOUNTER — Encounter (HOSPITAL_BASED_OUTPATIENT_CLINIC_OR_DEPARTMENT_OTHER): Payer: Self-pay | Admitting: Physical Therapy

## 2024-02-08 ENCOUNTER — Ambulatory Visit (HOSPITAL_BASED_OUTPATIENT_CLINIC_OR_DEPARTMENT_OTHER): Admitting: Physical Therapy

## 2024-02-20 ENCOUNTER — Encounter (HOSPITAL_BASED_OUTPATIENT_CLINIC_OR_DEPARTMENT_OTHER): Payer: Self-pay

## 2024-02-20 ENCOUNTER — Ambulatory Visit (HOSPITAL_BASED_OUTPATIENT_CLINIC_OR_DEPARTMENT_OTHER): Admitting: Physical Therapy
